# Patient Record
Sex: Male | Born: 1960
Health system: Southern US, Community
[De-identification: ages and names within clinical notes are randomized; demographics above are authoritative.]

## PROBLEM LIST (undated history)

## (undated) DIAGNOSIS — I10 Essential (primary) hypertension: Secondary | ICD-10-CM

## (undated) DIAGNOSIS — R7989 Other specified abnormal findings of blood chemistry: Secondary | ICD-10-CM

## (undated) HISTORY — PX: OTHER SURGICAL HISTORY: SHX169

## (undated) HISTORY — DX: Essential (primary) hypertension: I10

## (undated) HISTORY — PX: SPINAL FUSION: SHX223

## (undated) HISTORY — DX: Other specified abnormal findings of blood chemistry: R79.89

## (undated) HISTORY — PX: JOINT REPLACEMENT: SHX530

---

## 2017-12-14 LAB — HM COLONOSCOPY

## 2018-03-19 DIAGNOSIS — G4700 Insomnia, unspecified: Secondary | ICD-10-CM | POA: Diagnosis not present

## 2018-03-19 DIAGNOSIS — E039 Hypothyroidism, unspecified: Secondary | ICD-10-CM | POA: Diagnosis not present

## 2018-03-19 DIAGNOSIS — Z23 Encounter for immunization: Secondary | ICD-10-CM | POA: Diagnosis not present

## 2018-03-19 DIAGNOSIS — I10 Essential (primary) hypertension: Secondary | ICD-10-CM | POA: Diagnosis not present

## 2018-04-18 DIAGNOSIS — I10 Essential (primary) hypertension: Secondary | ICD-10-CM | POA: Diagnosis not present

## 2018-10-17 DIAGNOSIS — E663 Overweight: Secondary | ICD-10-CM | POA: Diagnosis not present

## 2018-10-17 DIAGNOSIS — F330 Major depressive disorder, recurrent, mild: Secondary | ICD-10-CM | POA: Diagnosis not present

## 2018-10-17 DIAGNOSIS — E039 Hypothyroidism, unspecified: Secondary | ICD-10-CM | POA: Diagnosis not present

## 2018-10-17 DIAGNOSIS — I10 Essential (primary) hypertension: Secondary | ICD-10-CM | POA: Diagnosis not present

## 2018-12-30 DIAGNOSIS — E0789 Other specified disorders of thyroid: Secondary | ICD-10-CM | POA: Diagnosis not present

## 2018-12-30 DIAGNOSIS — E785 Hyperlipidemia, unspecified: Secondary | ICD-10-CM | POA: Diagnosis not present

## 2018-12-30 DIAGNOSIS — I10 Essential (primary) hypertension: Secondary | ICD-10-CM | POA: Diagnosis not present

## 2018-12-30 DIAGNOSIS — L821 Other seborrheic keratosis: Secondary | ICD-10-CM | POA: Diagnosis not present

## 2019-04-21 DIAGNOSIS — F330 Major depressive disorder, recurrent, mild: Secondary | ICD-10-CM | POA: Diagnosis not present

## 2019-04-21 DIAGNOSIS — Z0000 Encounter for general adult medical examination without abnormal findings: Secondary | ICD-10-CM | POA: Diagnosis not present

## 2019-04-21 DIAGNOSIS — E039 Hypothyroidism, unspecified: Secondary | ICD-10-CM | POA: Diagnosis not present

## 2019-06-09 DIAGNOSIS — E039 Hypothyroidism, unspecified: Secondary | ICD-10-CM | POA: Diagnosis not present

## 2019-06-09 DIAGNOSIS — Z0000 Encounter for general adult medical examination without abnormal findings: Secondary | ICD-10-CM | POA: Diagnosis not present

## 2019-09-25 ENCOUNTER — Other Ambulatory Visit: Payer: Self-pay

## 2019-09-25 ENCOUNTER — Ambulatory Visit (INDEPENDENT_AMBULATORY_CARE_PROVIDER_SITE_OTHER): Payer: BC Managed Care – PPO

## 2019-09-25 ENCOUNTER — Ambulatory Visit (INDEPENDENT_AMBULATORY_CARE_PROVIDER_SITE_OTHER): Payer: BC Managed Care – PPO | Admitting: Family Medicine

## 2019-09-25 ENCOUNTER — Encounter: Payer: Self-pay | Admitting: Family Medicine

## 2019-09-25 VITALS — BP 140/93 | HR 53 | Temp 98.0°F | Wt 211.8 lb

## 2019-09-25 DIAGNOSIS — M542 Cervicalgia: Secondary | ICD-10-CM

## 2019-09-25 IMAGING — DX DG CERVICAL SPINE COMPLETE 4+V
6 series · 6 of 6 positions shown · non-contrast
Comparison: None

CLINICAL DATA: None radicular neck pain

EXAM:
CERVICAL SPINE - COMPLETE 4+ VIEW

[c-spine lat]
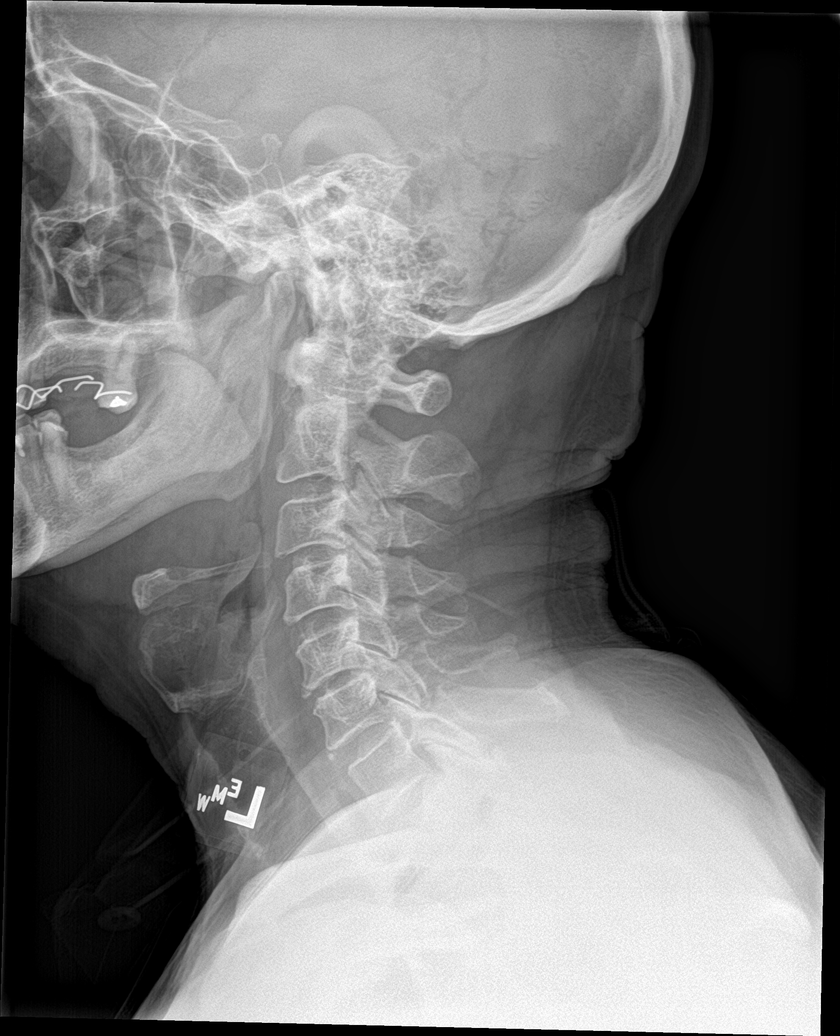

[c-spine obl (1 of 2)]
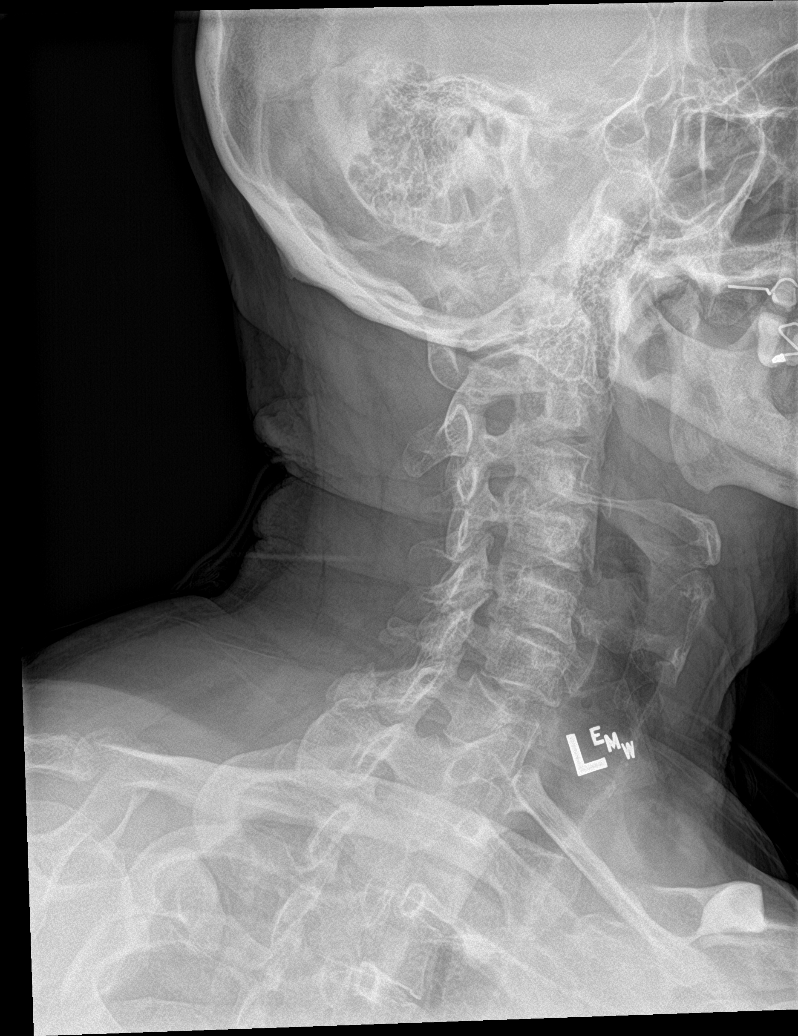

[c-spine obl (2 of 2)]
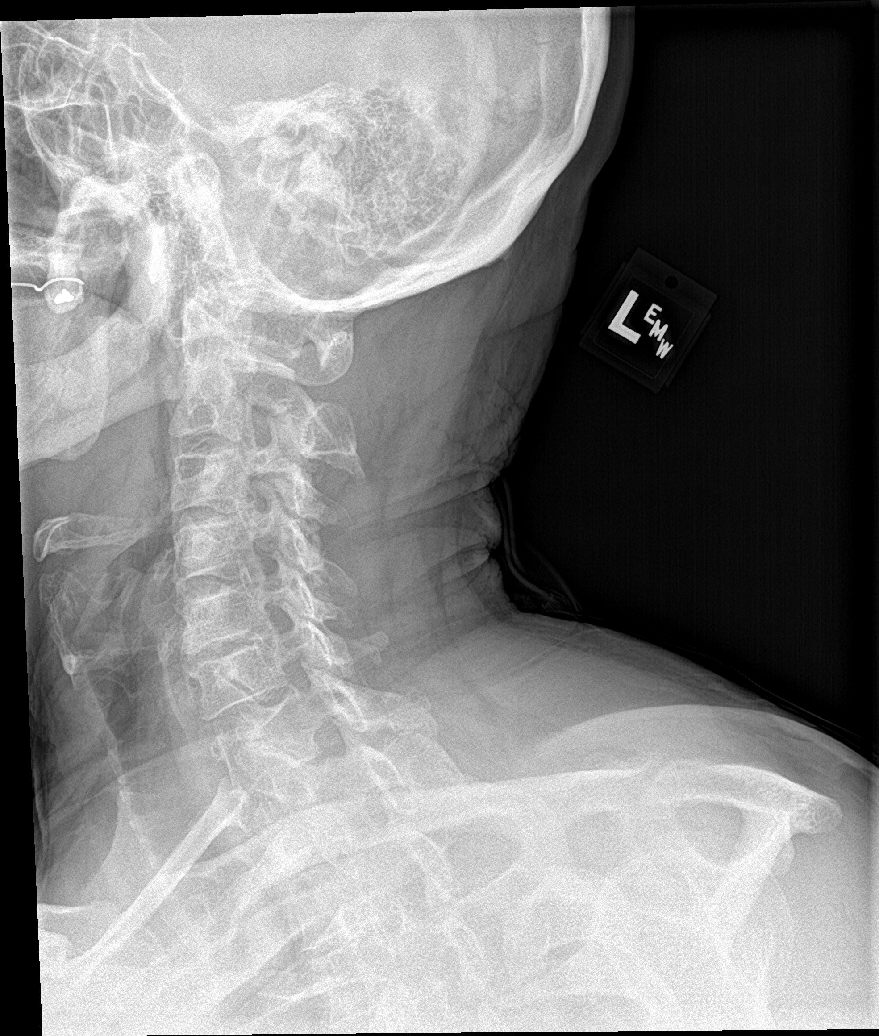

[c-spine ap]
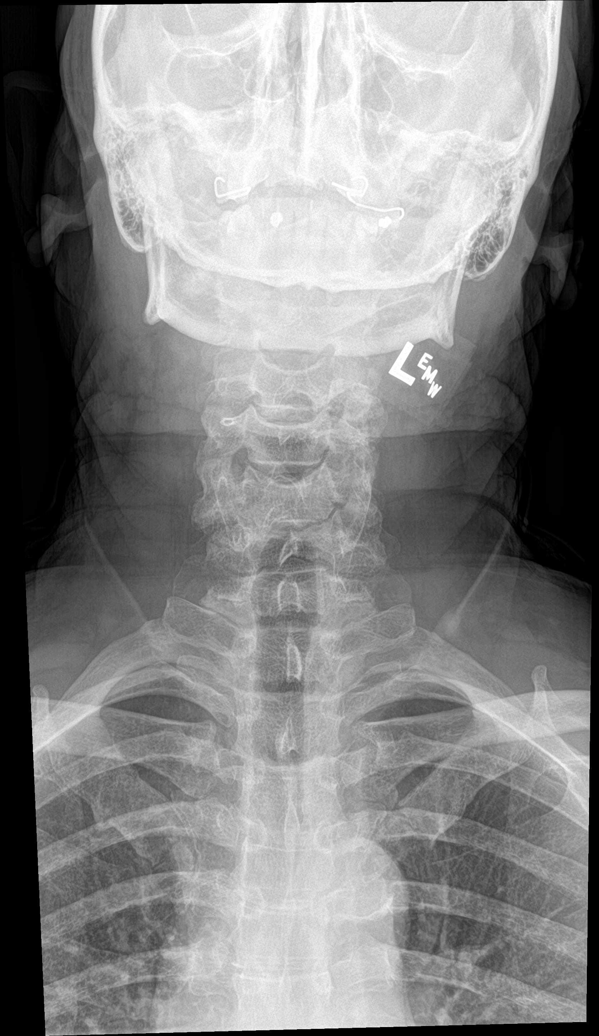

[c-spine open mouth]
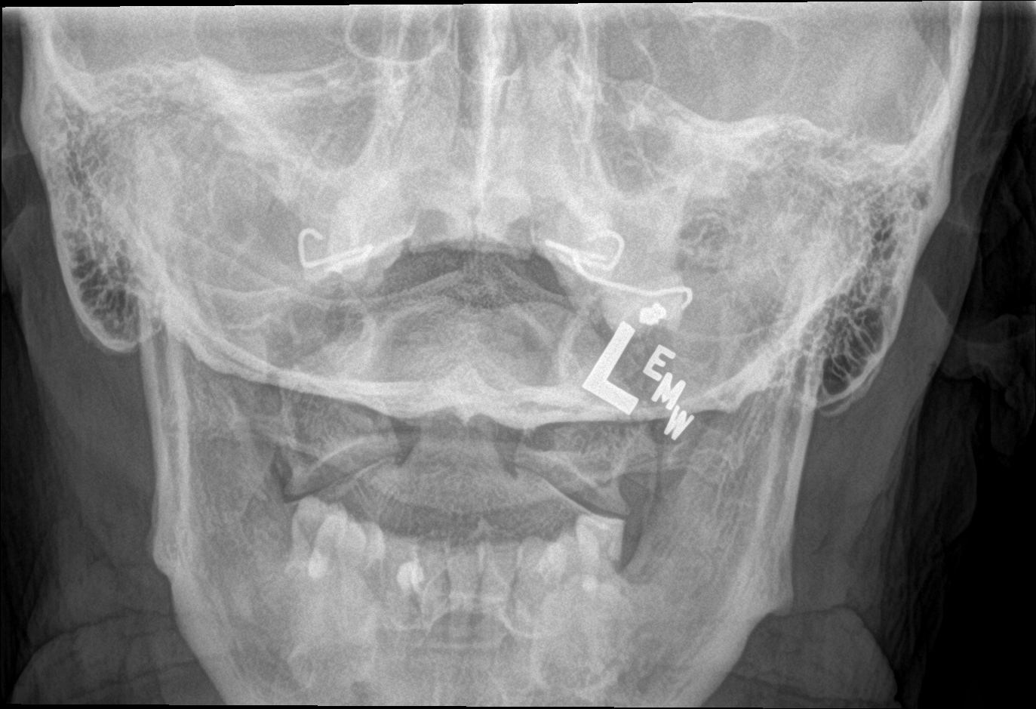

[[person_name]]
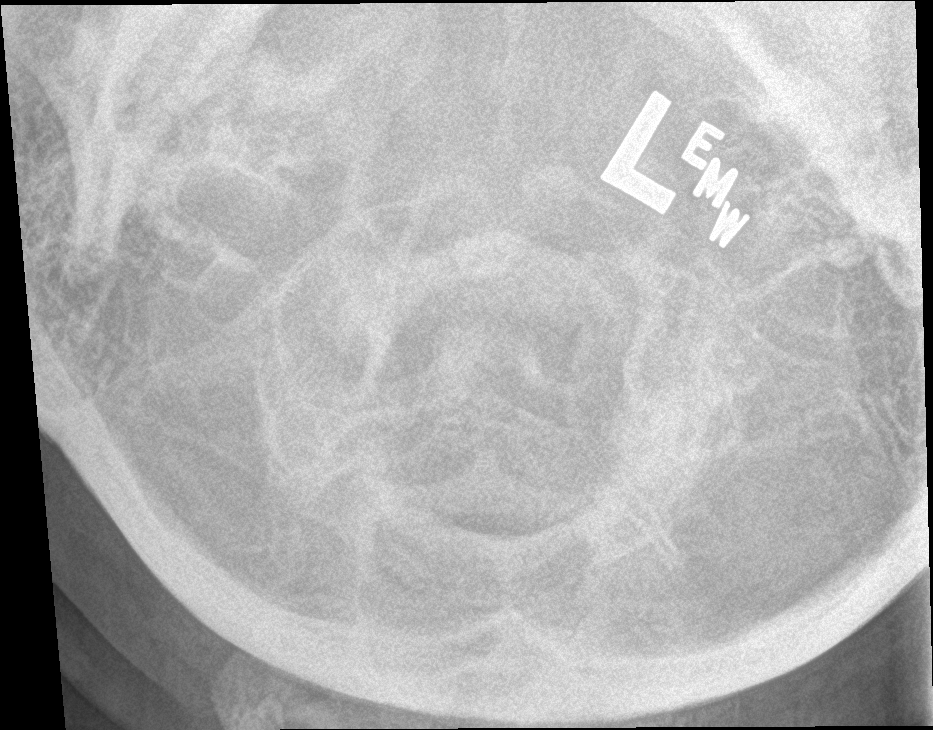

[6 of 6 positions shown; findings below may reference images not displayed]

FINDINGS: Osseous demineralization.

Prevertebral soft tissues normal thickness.

Disc space narrowing and endplate spur formation at C5-C6.

Scattered facet degenerative changes.

Encroachment upon RIGHT C5-C6 neural foramen by a combination of
uncovertebral and facet hypertrophy.

Scattered uncovertebral and facet hypertrophy at additional sites
neural foramina bilaterally, greatest LEFT C3-C4.

No fracture, subluxation or bone destruction.

Atherosclerotic calcification aorta.

C1-C2 alignment normal.
IMPRESSION: Degenerative disc disease changes at C5-C6.

Scattered mild multilevel facet degenerative changes.

Encroachment on cervical neural foramina especially RIGHT C5-C6 and
LEFT C3-C4 by uncovertebral and facet hypertrophy.

## 2019-09-25 MED ORDER — MELOXICAM 7.5 MG PO TABS: ORAL_TABLET | ORAL | 2 refills | Status: DC

## 2019-09-25 MED ORDER — TIZANIDINE HCL 4 MG PO TABS
4.0000 mg | ORAL_TABLET | Freq: Four times a day (QID) | ORAL | 0 refills | Status: DC | PRN
Start: 2019-09-25 — End: 2020-04-15

## 2019-09-25 NOTE — Progress Notes (Signed)
Brian Russell - 59 y.o. male MRN [id number]  Date of birth: [birth year]  Subjective Chief Complaint  Patient presents with  . Establish Care    HPI Brian Russell is a 59 y.o. male with history of HTN, hypothyroidism, hld and gerd here today for initial visit.  Recently moved to area from [place]. Has complaint of neck pain today.  Plans to f/u for chronic conditions next month as he will be due for updated lab work.   -Neck pain:  Pain and stiffness in neck for several months.  Feels grinding sensation when moving neck.  Denies radiation of pain, numbness or tingling into the arms.  He denies prior injury.  He has not tried anything for management of this.    ROS:  A comprehensive ROS was completed and negative except as noted per HPI  Allergies  Allergen Reactions  . Amlodipine   . Buspar [Buspirone]     Past Medical History:  Diagnosis Date  . Hypertension   . Low thyroid stimulating hormone (TSH) level     Past Surgical History:  Procedure Laterality Date  . fractured bone    . JOINT REPLACEMENT    . SPINAL FUSION      Social History   Socioeconomic History  . Marital status: Married    Spouse name: Not on file  . Number of children: Not on file  . Years of education: Not on file  . Highest education level: Not on file  Occupational History  . Occupation: Product manager  Tobacco Use  . Smoking status: Former Smoker    Types: Cigarettes    Quit date: 05/2011    Years since quitting: 8.3  . Smokeless tobacco: Never Used  Substance and Sexual Activity  . Alcohol use: Yes    Alcohol/week: 2.0 standard drinks    Types: 2 Standard drinks or equivalent per week  . Drug use: Never  . Sexual activity: Yes    Partners: Female  Other Topics Concern  . Not on file  Social History Narrative  . Not on file   Social Determinants of Health   Financial Resource Strain:   . Difficulty of Paying Living Expenses:   Food Insecurity:   . Worried About  Charity fundraiser in the Last Year:   . Arboriculturist in the Last Year:   Transportation Needs:   . Film/video editor (Medical):   Marland Kitchen Lack of Transportation (Non-Medical):   Physical Activity:   . Days of Exercise per Week:   . Minutes of Exercise per Session:   Stress:   . Feeling of Stress :   Social Connections:   . Frequency of Communication with Friends and Family:   . Frequency of Social Gatherings with Friends and Family:   . Attends Religious Services:   . Active Member of Clubs or Organizations:   . Attends Archivist Meetings:   Marland Kitchen Marital Status:     Family History  Problem Relation Age of Onset  . Hypertension Other   . Heart attack Other   . Stroke Other     Health Maintenance  Topic Date Due  . Hepatitis C Screening  Never done  . HIV Screening  Never done  . COVID-19 Vaccine (1) Never done  . COLONOSCOPY  Never done  . INFLUENZA VACCINE  12/14/2019  . TETANUS/TDAP  07/24/2021     ----------------------------------------------------------------------------------------------------------------------------------------------------------------------------------------------------------------- Physical Exam BP (!) 140/93 (BP Location: Left Arm, Patient Position: Sitting,  Cuff Size: Large)   Pulse (!) 53   Temp 98 F (36.7 C) (Temporal)   Wt 211 lb 12.8 oz (96.1 kg)   SpO2 97%   Physical Exam Constitutional:      Appearance: Normal appearance.  Eyes:     General: No scleral icterus. Neck:     Comments: ROM is normal but with pain.  Negative spurling.   Ttp along upper cervical paraspinals Cardiovascular:     Rate and Rhythm: Normal rate and regular rhythm.  Pulmonary:     Effort: Pulmonary effort is normal.     Breath sounds: Normal breath sounds.  Neurological:     General: No focal deficit present.     Mental Status: He is alert.  Psychiatric:        Mood and Affect: Mood normal.        Behavior: Behavior normal.      ------------------------------------------------------------------------------------------------------------------------------------------------------------------------------------------------------------------- Assessment and Plan  Neck pain Xray ordered Start tizanidine and meloxicam.  Given handout for home stretches.    Meds ordered this encounter  Medications  . tiZANidine (ZANAFLEX) 4 MG tablet    Sig: Take 1 tablet (4 mg total) by mouth every 6 (six) hours as needed for muscle spasms.    Dispense:  30 tablet    Refill:  0  . meloxicam (MOBIC) 7.5 MG tablet    Sig: 1 tab PO 1-2x per day    Dispense:  30 tablet    Refill:  2    Return in about 6 weeks (around 11/06/2019) for HTN/HLD/Hypothyroidism.    This visit occurred during the SARS-CoV-2 public health emergency.  Safety protocols were in place, including screening questions prior to the visit, additional usage of staff PPE, and extensive cleaning of exam room while observing appropriate contact time as indicated for disinfecting solutions.

## 2019-09-25 NOTE — Assessment & Plan Note (Signed)
Xray ordered Start tizanidine and meloxicam.  Given handout for home stretches.

## 2019-09-25 NOTE — Patient Instructions (Signed)
Great to meet you today! Have xray completed.  Tizanidine and meloxicam as needed.  Try Stretches below.  See me in about 6 weeks.    Neck Exercises Ask your health care provider which exercises are safe for you. Do exercises exactly as told by your health care provider and adjust them as directed. It is normal to feel mild stretching, pulling, tightness, or discomfort as you do these exercises. Stop right away if you feel sudden pain or your pain gets worse. Do not begin these exercises until told by your health care provider. Neck exercises can be important for many reasons. They can improve strength and maintain flexibility in your neck, which will help your upper back and prevent neck pain. Stretching exercises Rotation neck stretching  1. Sit in a chair or stand up. 2. Place your feet flat on the floor, shoulder width apart. 3. Slowly turn your head (rotate) to the right until a slight stretch is felt. Turn it all the way to the right so you can look over your right shoulder. Do not tilt or tip your head. 4. Hold this position for 10-30 seconds. 5. Slowly turn your head (rotate) to the left until a slight stretch is felt. Turn it all the way to the left so you can look over your left shoulder. Do not tilt or tip your head. 6. Hold this position for 10-30 seconds. Repeat __________ times. Complete this exercise __________ times a day. Neck retraction 1. Sit in a sturdy chair or stand up. 2. Look straight ahead. Do not bend your neck. 3. Use your fingers to push your chin backward (retraction). Do not bend your neck for this movement. Continue to face straight ahead. If you are doing the exercise properly, you will feel a slight sensation in your throat and a stretch at the back of your neck. 4. Hold the stretch for 1-2 seconds. Repeat __________ times. Complete this exercise __________ times a day. Strengthening exercises Neck press 1. Lie on your back on a firm bed or on the floor  with a pillow under your head. 2. Use your neck muscles to push your head down on the pillow and straighten your spine. 3. Hold the position as well as you can. Keep your head facing up (in a neutral position) and your chin tucked. 4. Slowly count to 5 while holding this position. Repeat __________ times. Complete this exercise __________ times a day. Isometrics These are exercises in which you strengthen the muscles in your neck while keeping your neck still (isometrics). 1. Sit in a supportive chair and place your hand on your forehead. 2. Keep your head and face facing straight ahead. Do not flex or extend your neck while doing isometrics. 3. Push forward with your head and neck while pushing back with your hand. Hold for 10 seconds. 4. Do the sequence again, this time putting your hand against the back of your head. Use your head and neck to push backward against the hand pressure. 5. Finally, do the same exercise on either side of your head, pushing sideways against the pressure of your hand. Repeat __________ times. Complete this exercise __________ times a day. Prone head lifts 1. Lie face-down (prone position), resting on your elbows so that your chest and upper back are raised. 2. Start with your head facing downward, near your chest. Position your chin either on or near your chest. 3. Slowly lift your head upward. Lift until you are looking straight ahead. Then continue lifting  your head as far back as you can comfortably stretch. 4. Hold your head up for 5 seconds. Then slowly lower it to your starting position. Repeat __________ times. Complete this exercise __________ times a day. Supine head lifts 1. Lie on your back (supine position), bending your knees to point to the ceiling and keeping your feet flat on the floor. 2. Lift your head slowly off the floor, raising your chin toward your chest. 3. Hold for 5 seconds. Repeat __________ times. Complete this exercise __________ times  a day. Scapular retraction 1. Stand with your arms at your sides. Look straight ahead. 2. Slowly pull both shoulders (scapulae) backward and downward (retraction) until you feel a stretch between your shoulder blades in your upper back. 3. Hold for 10-30 seconds. 4. Relax and repeat. Repeat __________ times. Complete this exercise __________ times a day. Contact a health care provider if:  Your neck pain or discomfort gets much worse when you do an exercise.  Your neck pain or discomfort does not improve within 2 hours after you exercise. If you have any of these problems, stop exercising right away. Do not do the exercises again unless your health care provider says that you can. Get help right away if:  You develop sudden, severe neck pain. If this happens, stop exercising right away. Do not do the exercises again unless your health care provider says that you can. This information is not intended to replace advice given to you by your health care provider. Make sure you discuss any questions you have with your health care provider. Document Revised: 02/27/2018 Document Reviewed: 02/27/2018 Elsevier Patient Education  [place].

## 2019-09-29 ENCOUNTER — Other Ambulatory Visit: Payer: Self-pay

## 2019-09-29 ENCOUNTER — Telehealth: Payer: Self-pay | Admitting: Family Medicine

## 2019-09-29 DIAGNOSIS — E785 Hyperlipidemia, unspecified: Secondary | ICD-10-CM

## 2019-09-29 DIAGNOSIS — I10 Essential (primary) hypertension: Secondary | ICD-10-CM

## 2019-09-29 DIAGNOSIS — Z125 Encounter for screening for malignant neoplasm of prostate: Secondary | ICD-10-CM

## 2019-09-29 DIAGNOSIS — Z0000 Encounter for general adult medical examination without abnormal findings: Secondary | ICD-10-CM

## 2019-09-29 NOTE — Telephone Encounter (Signed)
CMP, CBC, Lipid, TSH, PSA Diagnosis: Well adult, HLD, essential hypertension and screening for prostate cancer.   Thanks!  CM

## 2019-09-29 NOTE — Telephone Encounter (Signed)
What labs would you like ordered for Mr. Bramblett physical?

## 2019-09-29 NOTE — Telephone Encounter (Signed)
Pt called. He scheduled his physical for 5/26 and would like to have labs sent down on 5/24.  Thanks

## 2019-09-29 NOTE — Telephone Encounter (Signed)
Labs have been ordered

## 2019-10-06 ENCOUNTER — Encounter: Payer: Self-pay | Admitting: Family Medicine

## 2019-10-07 DIAGNOSIS — I10 Essential (primary) hypertension: Secondary | ICD-10-CM | POA: Diagnosis not present

## 2019-10-07 DIAGNOSIS — Z0000 Encounter for general adult medical examination without abnormal findings: Secondary | ICD-10-CM | POA: Diagnosis not present

## 2019-10-07 DIAGNOSIS — Z125 Encounter for screening for malignant neoplasm of prostate: Secondary | ICD-10-CM | POA: Diagnosis not present

## 2019-10-08 ENCOUNTER — Encounter: Payer: BC Managed Care – PPO | Admitting: Family Medicine

## 2019-10-08 LAB — LIPID PANEL
Cholesterol: 190 mg/dL (ref <200)
HDL: 46 mg/dL (ref >=40)
LDL Cholesterol (Calc): 126 mg/dL (calc) — ABNORMAL HIGH
Non-HDL Cholesterol (Calc): 144 mg/dL (calc) — ABNORMAL HIGH (ref <130)
Total CHOL/HDL Ratio: 4.1 (calc) (ref <5.0)
Triglycerides: 83 mg/dL (ref <150)

## 2019-10-08 LAB — COMPLETE METABOLIC PANEL WITH GFR
AG Ratio: 2.2 (calc) (ref 1.0–2.5)
ALT: 18 U/L (ref 9–46)
AST: 17 U/L (ref 10–35)
Albumin: 4.4 g/dL (ref 3.6–5.1)
Alkaline phosphatase (APISO): 43 U/L (ref 35–144)
BUN: 19 mg/dL (ref 7–25)
CO2: 32 mmol/L (ref 20–32)
Calcium: 9.3 mg/dL (ref 8.6–10.3)
Chloride: 101 mmol/L (ref 98–110)
Creat: 1.08 mg/dL (ref 0.70–1.25)
GFR, Est African American: 86 mL/min/{1.73_m2} (ref >=60)
GFR, Est Non African American: 74 mL/min/{1.73_m2} (ref >=60)
Globulin: 2 g/dL (calc) (ref 1.9–3.7)
Glucose, Bld: 88 mg/dL (ref 65–99)
Potassium: 4.2 mmol/L (ref 3.5–5.3)
Sodium: 141 mmol/L (ref 135–146)
Total Bilirubin: 0.6 mg/dL (ref 0.2–1.2)
Total Protein: 6.4 g/dL (ref 6.1–8.1)

## 2019-10-08 LAB — CBC WITH DIFFERENTIAL/PLATELET
Absolute Monocytes: 555 cells/uL (ref 200–950)
Basophils Absolute: 58 cells/uL (ref 0–200)
Basophils Relative: 0.8 %
Eosinophils Absolute: 168 cells/uL (ref 15–500)
Eosinophils Relative: 2.3 %
HCT: 42.9 % (ref 38.5–50.0)
Hemoglobin: 14.4 g/dL (ref 13.2–17.1)
Lymphs Abs: 1891 cells/uL (ref 850–3900)
MCH: 30.3 pg (ref 27.0–33.0)
MCHC: 33.6 g/dL (ref 32.0–36.0)
MCV: 90.3 fL (ref 80.0–100.0)
MPV: 10.3 fL (ref 7.5–12.5)
Monocytes Relative: 7.6 %
Neutro Abs: 4628 cells/uL (ref 1500–7800)
Neutrophils Relative %: 63.4 %
Platelets: 163 10*3/uL (ref 140–400)
RBC: 4.75 10*6/uL (ref 4.20–5.80)
RDW: 12.9 % (ref 11.0–15.0)
Total Lymphocyte: 25.9 %
WBC: 7.3 10*3/uL (ref 3.8–10.8)

## 2019-10-08 LAB — TSH: TSH: 0.6 mIU/L (ref 0.40–4.50)

## 2019-10-08 LAB — PSA: PSA: 0.4 ng/mL (ref <=4.0)

## 2019-10-09 ENCOUNTER — Encounter: Payer: BC Managed Care – PPO | Admitting: Family Medicine

## 2019-10-15 ENCOUNTER — Ambulatory Visit (INDEPENDENT_AMBULATORY_CARE_PROVIDER_SITE_OTHER): Payer: BC Managed Care – PPO | Admitting: Family Medicine

## 2019-10-15 ENCOUNTER — Encounter: Payer: Self-pay | Admitting: Family Medicine

## 2019-10-15 ENCOUNTER — Other Ambulatory Visit: Payer: Self-pay

## 2019-10-15 VITALS — BP 130/83 | HR 59 | Wt 209.7 lb

## 2019-10-15 DIAGNOSIS — I10 Essential (primary) hypertension: Secondary | ICD-10-CM | POA: Diagnosis not present

## 2019-10-15 DIAGNOSIS — E039 Hypothyroidism, unspecified: Secondary | ICD-10-CM | POA: Diagnosis not present

## 2019-10-15 DIAGNOSIS — M545 Low back pain, unspecified: Secondary | ICD-10-CM

## 2019-10-15 DIAGNOSIS — B353 Tinea pedis: Secondary | ICD-10-CM

## 2019-10-15 DIAGNOSIS — D489 Neoplasm of uncertain behavior, unspecified: Secondary | ICD-10-CM | POA: Diagnosis not present

## 2019-10-15 DIAGNOSIS — Z0000 Encounter for general adult medical examination without abnormal findings: Secondary | ICD-10-CM | POA: Insufficient documentation

## 2019-10-15 DIAGNOSIS — E785 Hyperlipidemia, unspecified: Secondary | ICD-10-CM | POA: Insufficient documentation

## 2019-10-15 DIAGNOSIS — G8929 Other chronic pain: Secondary | ICD-10-CM

## 2019-10-15 MED ORDER — LOSARTAN POTASSIUM 100 MG PO TABS: 100.0000 mg | ORAL_TABLET | Freq: Every day | ORAL | 2 refills | Status: DC

## 2019-10-15 MED ORDER — TERBINAFINE HCL 250 MG PO TABS: 250.0000 mg | ORAL_TABLET | Freq: Every day | ORAL | 0 refills | Status: AC

## 2019-10-15 MED ORDER — CHLORTHALIDONE 25 MG PO TABS: 25.0000 mg | ORAL_TABLET | Freq: Every day | ORAL | 2 refills | Status: DC

## 2019-10-15 NOTE — Progress Notes (Signed)
Brian Russell - 59 y.o. male MRN [id number]  Date of birth: 09/25/1960  Subjective Chief Complaint  Patient presents with  . Annual Exam    HPI Brian Russell is a 59 y.o. male here today for annual exam.  He needs refills on chlorthalidone and losartan for his BP.  He is tolerating these well and BP remains well controlled.    Recent labs reviewed.  TSH wnl.  LDL elevated.  He has rx for lipitor but has not started yet.    He has history of chronic low back pain.  Had decompression with fusion previously however this failed to relieve his pain.  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  He never filed a claim during service because he was always told to tough it out.  He is in the process of filing a disability claim through the [place].    Also has had recurrent athletes foot. Has tried otc topicals but keeps returning.    He is a former smoker.  He consumes EtOH occasionally.   He remains pretty active and tries to follow a healthy diet.   Review of Systems  Constitutional: Negative for chills, fever, malaise/fatigue and weight loss.  HENT: Negative for congestion, ear pain and sore throat.   Eyes: Negative for blurred vision, double vision and pain.  Respiratory: Negative for cough and shortness of breath.   Cardiovascular: Negative for chest pain and palpitations.  Gastrointestinal: Negative for abdominal pain, blood in stool, constipation, heartburn and nausea.  Genitourinary: Negative for dysuria and urgency.  Musculoskeletal: Positive for back pain. Negative for joint pain and myalgias.  Neurological: Negative for dizziness and headaches.  Endo/Heme/Allergies: Does not bruise/bleed easily.  Psychiatric/Behavioral: Negative for depression. The patient is not nervous/anxious and does not have insomnia.     Allergies  Allergen Reactions  . Amlodipine   . Buspar [Buspirone]     Past Medical History:   Diagnosis Date  . Hypertension   . Low thyroid stimulating hormone (TSH) level     Past Surgical History:  Procedure Laterality Date  . fractured bone    . JOINT REPLACEMENT    . SPINAL FUSION      Social History   Socioeconomic History  . Marital status: Married    Spouse name: Not on file  . Number of children: Not on file  . Years of education: Not on file  . Highest education level: Not on file  Occupational History  . Occupation: Product manager  Tobacco Use  . Smoking status: Former Smoker    Types: Cigarettes    Quit date: 05/2011    Years since quitting: 8.4  . Smokeless tobacco: Never Used  Substance and Sexual Activity  . Alcohol use: Yes    Alcohol/week: 2.0 standard drinks    Types: 2 Standard drinks or equivalent per week  . Drug use: Never  . Sexual activity: Yes    Partners: Female  Other Topics Concern  . Not on file  Social History Narrative  . Not on file   Social Determinants of Health   Financial Resource Strain:   . Difficulty of Paying Living Expenses:   Food Insecurity:   . Worried About Charity fundraiser in the Last Year:   . Arboriculturist in the Last Year:   Transportation Needs:   . Film/video editor (Medical):   Marland Kitchen Lack of  Transportation (Non-Medical):   Physical Activity:   . Days of Exercise per Week:   . Minutes of Exercise per Session:   Stress:   . Feeling of Stress :   Social Connections:   . Frequency of Communication with Friends and Family:   . Frequency of Social Gatherings with Friends and Family:   . Attends Religious Services:   . Active Member of Clubs or Organizations:   . Attends Archivist Meetings:   Marland Kitchen Marital Status:     Family History  Problem Relation Age of Onset  . Hypertension Other   . Heart attack Other   . Stroke Other     Health Maintenance  Topic Date Due  . Hepatitis C Screening  Never done  . COVID-19 Vaccine (1) Never done  . HIV Screening  Never done  .  INFLUENZA VACCINE  12/14/2019  . TETANUS/TDAP  07/24/2021  . COLONOSCOPY  12/15/2027     ----------------------------------------------------------------------------------------------------------------------------------------------------------------------------------------------------------------- Physical Exam BP 130/83 (BP Location: Left Arm, Patient Position: Sitting, Cuff Size: Normal)   Pulse (!) 59   Wt 209 lb 11.2 oz (95.1 kg)   SpO2 97%   Physical Exam Constitutional:      General: He is not in acute distress. HENT:     Head: Normocephalic and atraumatic.     Right Ear: External ear normal.     Left Ear: External ear normal.  Eyes:     General: No scleral icterus. Neck:     Thyroid: No thyromegaly.  Cardiovascular:     Rate and Rhythm: Normal rate and regular rhythm.     Heart sounds: Normal heart sounds.  Pulmonary:     Effort: Pulmonary effort is normal.     Breath sounds: Normal breath sounds.  Abdominal:     General: Bowel sounds are normal. There is no distension.     Palpations: Abdomen is soft.     Tenderness: There is no abdominal tenderness. There is no guarding.  Musculoskeletal:     Cervical back: Normal range of motion.  Lymphadenopathy:     Cervical: No cervical adenopathy.  Skin:    General: Skin is warm and dry.     Findings: No rash.     Comments: Dry, flaky skin on erythematous base on bilateral feet.   Neurological:     Mental Status: He is alert and oriented to person, place, and time.     Cranial Nerves: No cranial nerve deficit.     Motor: No abnormal muscle tone.  Psychiatric:        Behavior: Behavior normal.     ------------------------------------------------------------------------------------------------------------------------------------------------------------------------------------------------------------------- Assessment and Plan  Essential hypertension Blood pressure is at goal at for age and co-morbidities.  I  recommend he continue current medications.  Chlorthalidone and losartan renewed.  In addition they were instructed to follow a low sodium diet with regular exercise to help to maintain adequate control of blood pressure.    Hypothyroidism Doing well.  Recent TSH wnl.   HLD (hyperlipidemia) LDL elevated.  The 10-year ASCVD risk score: 10.4% Recommend starting atorvastatin.      Chronic low back pain Failed laminectomy/decompression with continued pain.  He will plan to schedule appt with Dr. Darene Lamer to discuss additional conservative options for management of his back pain.  He is currently applying for disability through the [place] as well.    Tinea pedis He has tried and failed trial of topical medications.  Start terbinafine 250mg  daily x6 weeks.   Well adult  exam Recent labs and chronic conditions reviewed.  He is up to date on immunizations. Declines COVID vaccine.  Screenings: UTD Anticipatory guidance/risk factor reduction: See additional problems and AVS  Neoplasm of uncertain behavior Requests dermatology referral, entered.    Meds ordered this encounter  Medications  . terbinafine (LAMISIL) 250 MG tablet    Sig: Take 1 tablet (250 mg total) by mouth daily.    Dispense:  42 tablet    Refill:  0  . chlorthalidone (HYGROTON) 25 MG tablet    Sig: Take 1 tablet (25 mg total) by mouth daily.    Dispense:  90 tablet    Refill:  2  . losartan (COZAAR) 100 MG tablet    Sig: Take 1 tablet (100 mg total) by mouth daily.    Dispense:  90 tablet    Refill:  2    Return in about 6 months (around 04/15/2020) for HTN/HLD.    This visit occurred during the SARS-CoV-2 public health emergency.  Safety protocols were in place, including screening questions prior to the visit, additional usage of staff PPE, and extensive cleaning of exam room while observing appropriate contact time as indicated for disinfecting solutions.

## 2019-10-15 NOTE — Patient Instructions (Signed)
Great to see you! I would recommend adding on atorvastatin (lipitor) to reduce your risk of heart disease.  I would recommend having you see Dr. Darene Lamer for your back pain.  I have entered referral to dermatology, you will be contacted for appointment.   I will plan to follow up with you in 6 months.

## 2019-10-15 NOTE — Assessment & Plan Note (Signed)
LDL elevated.  The 10-year ASCVD risk score: 10.4% Recommend starting atorvastatin.

## 2019-10-15 NOTE — Assessment & Plan Note (Signed)
Doing well.  Recent TSH wnl.

## 2019-10-15 NOTE — Assessment & Plan Note (Signed)
Recent labs and chronic conditions reviewed.  He is up to date on immunizations. Declines COVID vaccine.  Screenings: UTD Anticipatory guidance/risk factor reduction: See additional problems and AVS

## 2019-10-15 NOTE — Assessment & Plan Note (Signed)
He has tried and failed trial of topical medications.  Start terbinafine 250mg  daily x6 weeks.

## 2019-10-15 NOTE — Assessment & Plan Note (Signed)
Failed laminectomy/decompression with continued pain.  He will plan to schedule appt with Dr. Darene Lamer to discuss additional conservative options for management of his back pain.  He is currently applying for disability through the [place] as well.

## 2019-10-15 NOTE — Assessment & Plan Note (Signed)
Requests dermatology referral, entered.

## 2019-10-15 NOTE — Assessment & Plan Note (Signed)
Blood pressure is at goal at for age and co-morbidities.  I recommend he continue current medications.  Chlorthalidone and losartan renewed.  In addition they were instructed to follow a low sodium diet with regular exercise to help to maintain adequate control of blood pressure.

## 2019-10-22 ENCOUNTER — Institutional Professional Consult (permissible substitution): Payer: BC Managed Care – PPO | Admitting: Sports Medicine

## 2019-10-29 ENCOUNTER — Ambulatory Visit (INDEPENDENT_AMBULATORY_CARE_PROVIDER_SITE_OTHER): Payer: BC Managed Care – PPO

## 2019-10-29 ENCOUNTER — Ambulatory Visit (INDEPENDENT_AMBULATORY_CARE_PROVIDER_SITE_OTHER): Payer: BC Managed Care – PPO | Admitting: Sports Medicine

## 2019-10-29 ENCOUNTER — Encounter: Payer: Self-pay | Admitting: Sports Medicine

## 2019-10-29 ENCOUNTER — Other Ambulatory Visit: Payer: Self-pay

## 2019-10-29 DIAGNOSIS — M545 Low back pain, unspecified: Secondary | ICD-10-CM

## 2019-10-29 DIAGNOSIS — G8929 Other chronic pain: Secondary | ICD-10-CM | POA: Diagnosis not present

## 2019-10-29 IMAGING — DX DG LUMBAR SPINE COMPLETE 4+V
5 series · 5 of 5 positions shown · non-contrast
Comparison: None.

CLINICAL DATA: Low back pain.  No known injury.

EXAM:
LUMBAR SPINE - COMPLETE 4+ VIEW

[l-spine ap]
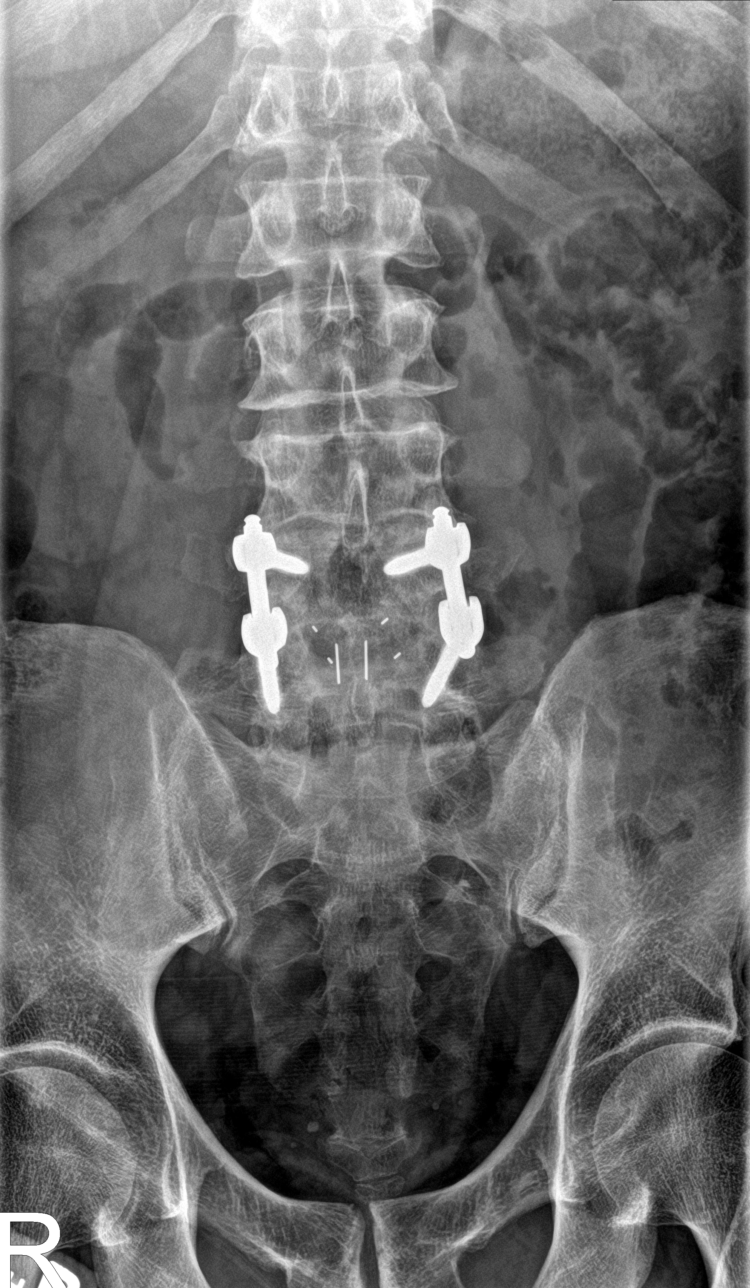

[l-spine obl (1 of 2)]
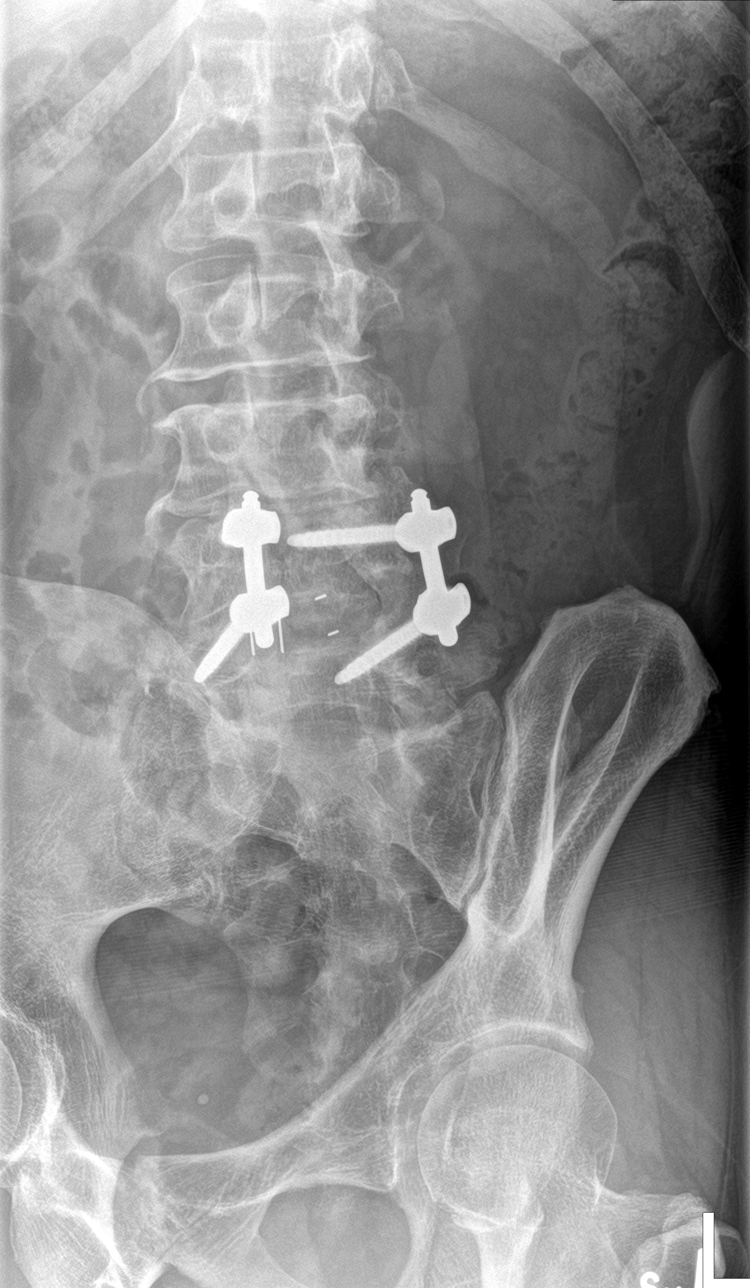

[l-spine obl (2 of 2)]
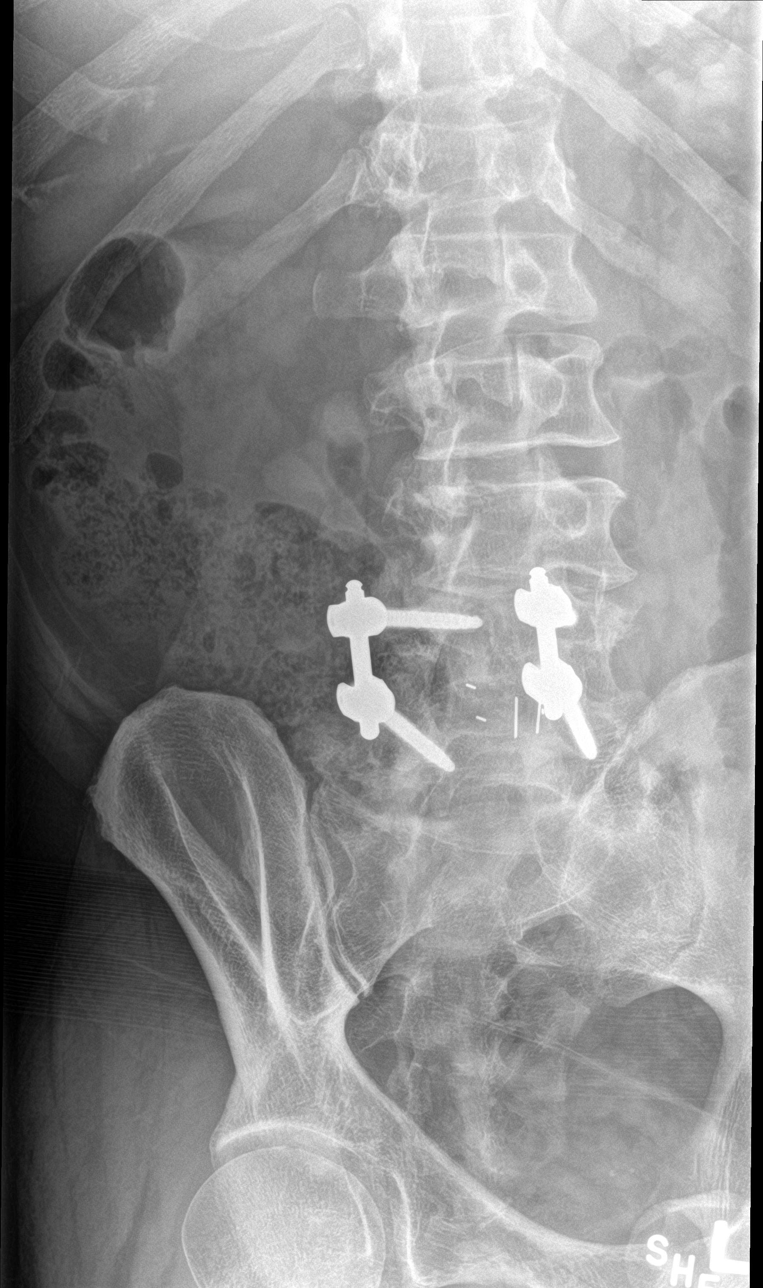

[l-spine lat]
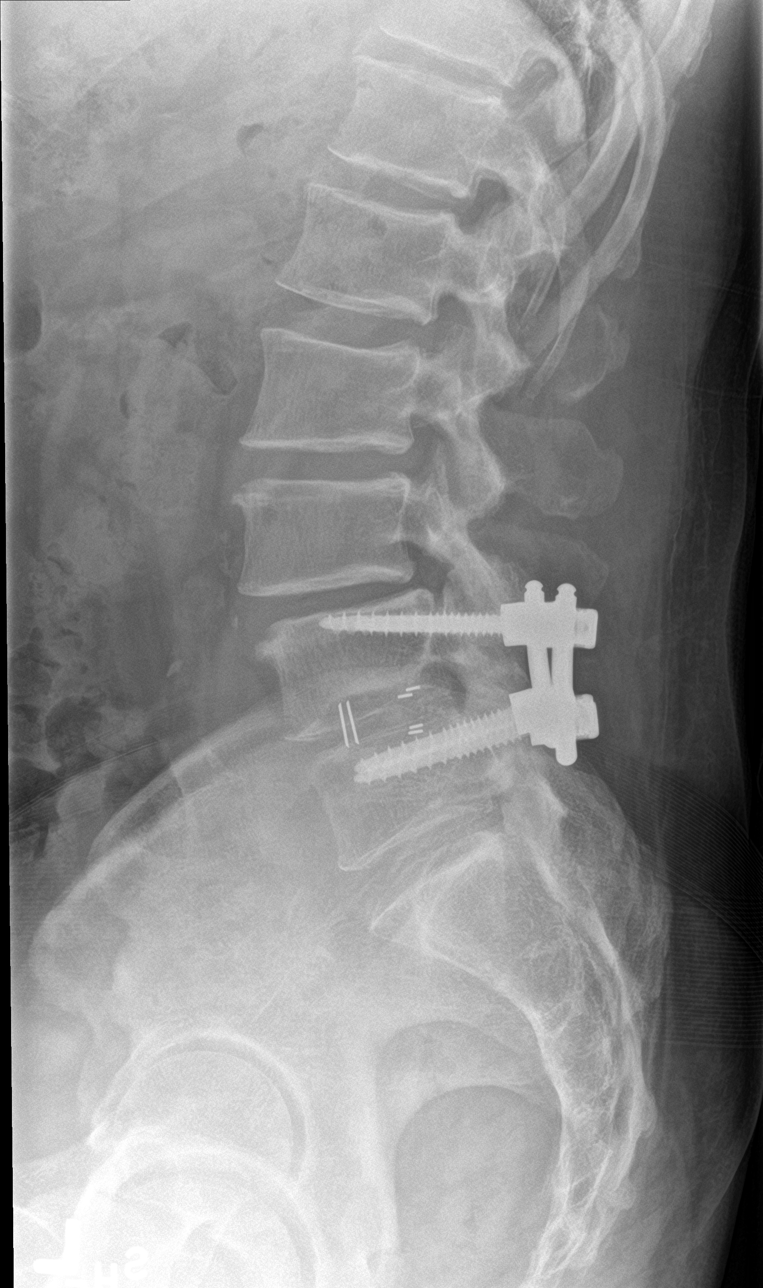

[l-spine spot]
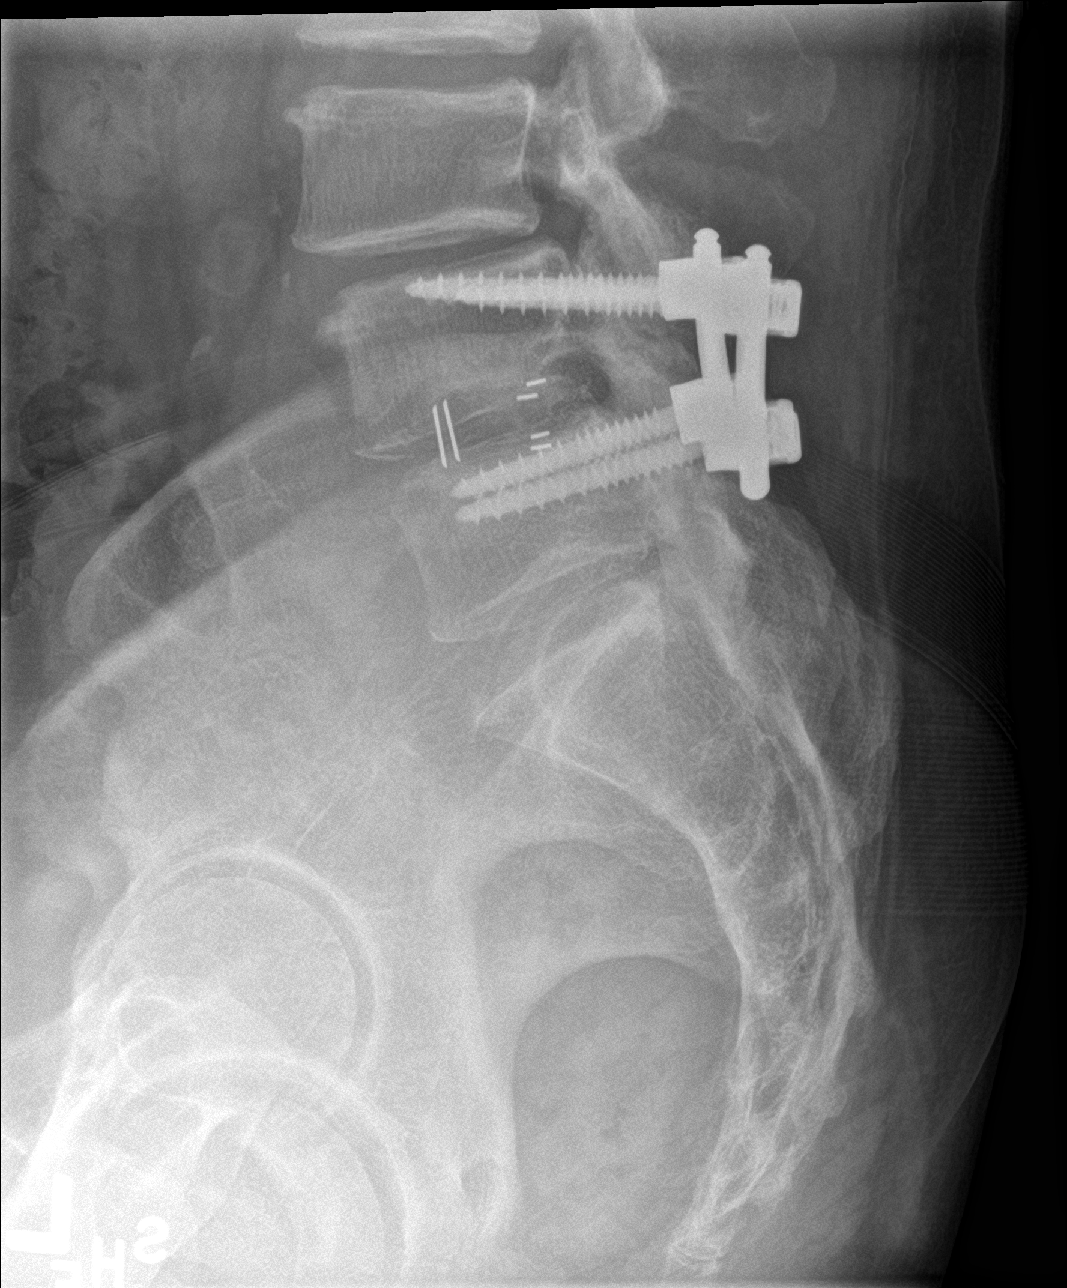

[5 of 5 positions shown; findings below may reference images not displayed]

FINDINGS: No evidence of fracture. Prior PLIF seen at L4-5. Mild-to-moderate
degenerative disc disease with grade 1 anterolisthesis measuring 6
mm is seen at L3-4. Mild degenerative disc disease seen at L2-3.
IMPRESSION: 1. No acute findings.
2. Previous PLIF at L4-5. Degenerative spondylosis, as described
above.

## 2019-10-29 MED ORDER — MELOXICAM 15 MG PO TABS: ORAL_TABLET | ORAL | 3 refills | Status: DC

## 2019-10-29 MED ORDER — GABAPENTIN 800 MG PO TABS: 800.0000 mg | ORAL_TABLET | Freq: Every day | ORAL | 3 refills | Status: DC

## 2019-10-29 NOTE — Assessment & Plan Note (Signed)
Brian Russell is a long history of low back pain, this started decades ago, he was injured in [employer], ultimately he needed what sounds to be a laminectomy. He never had any relief, not even temporary. More recently he has had a persistent pain, mid low back radiating into the hips and thighs but not past the knees, worse with standing, he does need to flex his spine when walking all consistent with lumbar spinal stenosis. He is interested in aggressive treatment without surgery. We will start conservative, full dose meloxicam, gabapentin 800 mg at night, x-rays, formal physical therapy. Return to see me in 6 weeks, MRI for interventional planning if no better.

## 2019-10-29 NOTE — Progress Notes (Signed)
    Procedures performed today:    None.  Independent interpretation of notes and tests performed by another provider:   None.  Brief History, Exam, Impression, and Recommendations:    Chronic low back pain Brian Russell is a long history of low back pain, this started decades ago, he was injured in [employer], ultimately he needed what sounds to be a laminectomy. He never had any relief, not even temporary. More recently he has had a persistent pain, mid low back radiating into the hips and thighs but not past the knees, worse with standing, he does need to flex his spine when walking all consistent with lumbar spinal stenosis. He is interested in aggressive treatment without surgery. We will start conservative, full dose meloxicam, gabapentin 800 mg at night, x-rays, formal physical therapy. Return to see me in 6 weeks, MRI for interventional planning if no better.    ___________________________________________ Gwen Her. Dianah Field, M.D., ABFM., CAQSM. Primary Care and [place] Instructor of [place] [facility]

## 2019-11-07 ENCOUNTER — Ambulatory Visit: Payer: BC Managed Care – PPO | Admitting: Physical Therapy

## 2019-11-10 ENCOUNTER — Other Ambulatory Visit: Payer: Self-pay

## 2019-11-10 ENCOUNTER — Ambulatory Visit (INDEPENDENT_AMBULATORY_CARE_PROVIDER_SITE_OTHER): Payer: BC Managed Care – PPO | Admitting: Rehabilitative and Restorative Service Providers"

## 2019-11-10 DIAGNOSIS — M545 Low back pain, unspecified: Secondary | ICD-10-CM

## 2019-11-10 DIAGNOSIS — R29898 Other symptoms and signs involving the musculoskeletal system: Secondary | ICD-10-CM

## 2019-11-10 DIAGNOSIS — G8929 Other chronic pain: Secondary | ICD-10-CM

## 2019-11-10 DIAGNOSIS — R2689 Other abnormalities of gait and mobility: Secondary | ICD-10-CM | POA: Diagnosis not present

## 2019-11-10 DIAGNOSIS — R293 Abnormal posture: Secondary | ICD-10-CM

## 2019-11-10 NOTE — Therapy (Signed)
[facility] [place], [place], [address] Phone: [phone]   Fax:  [phone]  Physical Therapy Evaluation  Patient Details  Name: Jacquelyn Antony MRN: [id number] Date of Birth: 01-30-1961 Referring Provider (PT): Dr Dianah Field    Encounter Date: 11/10/2019   PT End of Session - 11/10/19 1512    Visit Number 1    Number of Visits 12    Date for PT Re-Evaluation 12/22/19    PT Start Time 1512    PT Stop Time 1600    PT Time Calculation (min) 48 min    Activity Tolerance Patient tolerated treatment well           Past Medical History:  Diagnosis Date  . Hypertension   . Low thyroid stimulating hormone (TSH) level     Past Surgical History:  Procedure Laterality Date  . fractured bone    . JOINT REPLACEMENT    . SPINAL FUSION      There were no vitals filed for this visit.    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 There was not one specific injury - just over time had pain. He has lived with the pain for ~ 36 yrs. he has had some increase in symptoms in the past 2 years and wife motices that he has started to lean more forward and to the side.    Pertinent History lumbar surgery for treatment of LBP with rods and screws in L4/L5 ~ 8 yrs agono change in symptoms (Dr Juliene Pina - [facility]) DDD L3/4/5; HTN    Diagnostic tests MRI    Patient Stated Goals wants to ease the pain and get healthier    Currently in Pain? Yes    Pain Score 0-No pain   standing 8/10   Pain Location Back    Pain Orientation Right;Left;Lower    Pain Descriptors / Indicators Sharp;Constant   constant with stading or walking   Pain Type Chronic pain    Pain Radiating Towards bilat buttocks    Pain Onset More than a month ago    Pain Frequency Intermittent    Aggravating Factors  standing; walking; bending    Pain  Relieving Factors sitting; lying down              [facility] PT Assessment - 11/10/19 0001      Assessment   Medical Diagnosis LBP     Referring Provider (PT) Dr Dianah Field     Onset Date/Surgical Date 10/13/17   LBP x 36 yrs    Hand Dominance Right    Next MD Visit 12/10/19    Prior Therapy yes 36 yrs ago and following back sx 8 yrs ago - no improvement       Precautions   Precautions None      Restrictions   Weight Bearing Restrictions No      Balance Screen   Has the patient fallen in the past 6 months No    Has the patient had a decrease in activity level because of a fear of falling?  No    Is the patient reluctant to leave their home because of a fear of falling?  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; moving throughout the 9 hrs/day 5 days/wk to 5.5 days/wk - for the past 32 yrs     Leisure yard work; Pensions consultant; gun competetions       Observation/Other Assessments   Observations 2 finger rectus diastasis     Focus on Therapeutic Outcomes (FOTO)  51% limitation       Sensation   Additional Comments WFL's per pt report       Posture/Postural Control   Posture/Postural Control --   sits with elbows propped on knees flexed forward    Posture Comments upper body shifted to the Rt; flexed forward at hips; Lt pelvis higher than Rt       AROM   Lumbar Flexion 40% increased pain 70% with increasing pain - pain in the lower sacral area     Lumbar Extension 25% pain in lower sacral area    Lumbar - Right Side Bend 85% pain Rt side and lower sacral area    Lumbar - Left Side Bend 75% pain in the  lower sacral area     Lumbar - Right Rotation 15% pain in the lower sacral area    Lumbar - Left Rotation 30% pain in the lower sacral area       Strength   Overall Strength Comments not tested resistively - WFL's for all activities       Flexibility   Hamstrings tight ~ 75-80 deg tighter Rt than Lt     Quadriceps tighter Rt than Lt     ITB tight bilat Rt > Lt     Piriformis tight Lt > Rt       Palpation   Spinal mobility pain with CPA mobs ~ L3/4 area; tightness Lt > Rt coccyx with abnormal alignment of coccyx - tipped in     SI assessment  lower pelvis on Rt with lower fold Rt than Lt     Palpation comment tight hip flexors; lats; QL; multifidi       Special Tests   Other special tests Thomas test hip flexor tightness bilat Rt > Lt       Ambulation/Gait   Gait Comments abnormal gait pattern with trunk laterally flexed to Rt and forward                        Objective measurements completed on examination: See above findings.       [facility] Adult PT Treatment/Exercise - 11/10/19 0001      Knee/Hip Exercises: Stretches   Hip Flexor Stretch Right;Left;2 reps;30 seconds   thomas test postion - LE off edge of table    Piriformis Stretch Right;Left;2 reps;30 seconds   supine travell with strap                  PT Education - 11/10/19 1604    Education Details HEP POC DN Posture    Person(s) Educated Patient    Methods Explanation;Demonstration;Tactile cues;Verbal cues;Handout    Comprehension Verbalized understanding;Returned demonstration;Verbal cues required;Tactile cues required               PT Long Term Goals - 11/10/19 1702      PT  LONG TERM GOAL #1   Title Improve posture and alignment with patient to demonstrate more upright posture    Time 6    Period Weeks    Status New    Target Date 12/22/19      PT LONG TERM GOAL #2   Title Improve ROM and mobility through the hips(to neutral hip extension) and lumbar spine(by 20-25%)    Time 6     Period Weeks    Status New    Target Date 12/22/19      PT LONG TERM GOAL #3   Title Patient to report increased tolerance for standing; walking and functional activities    Time 6    Period Weeks    Status New    Target Date 12/22/19      PT LONG TERM GOAL #4   Title Independent in HEP    Time 6    Period Weeks    Status New    Target Date 12/22/19      PT LONG TERM GOAL #5   Baseline Improve FOTO to </= 40% limitation    Time 6    Period Weeks    Status New    Target Date 12/22/19                  Plan - 11/10/19 1646    Clinical Impression Statement Patient presents with chronic LBP x 36 years with no known specific injury - likely related to [employer] r[employer] plane jumps. He underwent lumbar L4/5 fusion ~ 8 yrs ago with no change in pain. Patient's wife reports that he is bending to the side and forward now when he walks - (for the past couple years). Patient has abnormal posture with trunk laterally flexed to the Rt and forward at hips: he has asymmetrical pelvic alignment; coccyx tightness; rectus diastasis; tight hips; limited and painful lumbar ROM; muscular tightness through hip flexors/lumbar and posterior hip musculatrue; pain with standing/walking/bending/functional activities. Patient will benefit from PT to address problems identified. Discussed treatment and expectations of treatment of chronic pain.    Personal Factors and Comorbidities Comorbidity 1;Comorbidity 2    Comorbidities chronic pain; HTN    Examination-Activity Limitations Locomotion Level;Stand;Stairs;Squat;Bend;Lift    Examination-Participation Restrictions Other    Stability/Clinical Decision Making Stable/Uncomplicated    Clinical Decision Making Low    Rehab Potential Fair    PT Frequency 2x / week    PT Duration 6 weeks    PT Treatment/Interventions Patient/family education;ADLs/Self Care Home Management;Cryotherapy;Electrical Stimulation;Iontophoresis 4mg /ml Dexamethasone;Moist  Heat;Aquatic Therapy;Traction;Ultrasound;Gait training;Stair training;Functional mobility training;Therapeutic activities;Therapeutic exercise;Balance training;Neuromuscular re-education;Manual techniques;Dry needling;Taping    PT Next Visit Plan review HEP; further assessment as indicated; progress with stretching for hip flexors/lats/QL; continue education; add core stabilization as indicated; manual work vs DN; modalities as indicated    PT Home Exercise Plan [email or web address]    Consulted and Agree with Plan of Care Patient           Patient will benefit from skilled therapeutic intervention in order to improve the following deficits and impairments:  Abnormal gait, Decreased range of motion, Increased fascial restricitons, Increased muscle spasms, Decreased activity tolerance, Pain, Impaired flexibility, Improper body mechanics, Decreased mobility, Decreased strength, Postural dysfunction  Visit Diagnosis: Chronic bilateral low back pain without sciatica - Plan: PT plan of care cert/re-cert  Abnormal posture - Plan: PT plan of care cert/re-cert  Other symptoms and signs involving the musculoskeletal system - Plan: PT plan of care cert/re-cert  Other abnormalities of  gait and mobility - Plan: PT plan of care cert/re-cert     Problem List Patient Active Problem List   Diagnosis Date Noted  . Essential hypertension 10/15/2019  . Hypothyroidism 10/15/2019  . HLD (hyperlipidemia) 10/15/2019  . Chronic low back pain 10/15/2019  . Tinea pedis 10/15/2019  . Well adult exam 10/15/2019  . Neoplasm of uncertain behavior 22/84/0698  . Neck pain 09/25/2019    Sharah Finnell Nilda Simmer PT, MPH  11/10/2019, 5:10 PM  [facility] [place], [place], [address] Phone: [phone]   Fax:  [phone]  Name: Slyvester Latona MRN: [id number] Date of Birth: 08-09-1960

## 2019-11-10 NOTE — Patient Instructions (Signed)
Access Code: [email or web address]: [email or web address]: 06/28/2021Prepared by: Juliauna Stueve HoltExercises  Supine Piriformis Stretch with Leg Straight - 2 x daily - 7 x weekly - 1 sets - 3 reps - 30 sec hold  Hip Flexor Stretch at Edge of Bed - 2 x daily - 7 x weekly - 1 sets - 3 reps - 30 sec hold Patient Education  Biomedical scientist  Trigger Point Dry Needling

## 2019-11-13 ENCOUNTER — Encounter: Payer: BC Managed Care – PPO | Admitting: Rehabilitative and Restorative Service Providers"

## 2019-11-20 ENCOUNTER — Ambulatory Visit (INDEPENDENT_AMBULATORY_CARE_PROVIDER_SITE_OTHER): Payer: BC Managed Care – PPO | Admitting: Rehabilitative and Restorative Service Providers"

## 2019-11-20 ENCOUNTER — Other Ambulatory Visit: Payer: Self-pay

## 2019-11-20 ENCOUNTER — Encounter: Payer: Self-pay | Admitting: Rehabilitative and Restorative Service Providers"

## 2019-11-20 DIAGNOSIS — M545 Low back pain, unspecified: Secondary | ICD-10-CM

## 2019-11-20 DIAGNOSIS — R2689 Other abnormalities of gait and mobility: Secondary | ICD-10-CM | POA: Diagnosis not present

## 2019-11-20 DIAGNOSIS — R29898 Other symptoms and signs involving the musculoskeletal system: Secondary | ICD-10-CM

## 2019-11-20 DIAGNOSIS — G8929 Other chronic pain: Secondary | ICD-10-CM

## 2019-11-20 DIAGNOSIS — R293 Abnormal posture: Secondary | ICD-10-CM

## 2019-11-20 NOTE — Therapy (Signed)
[facility] [place], [place], [address] Phone: [phone]   Fax:  [phone]  Physical Therapy Treatment  Patient Details  Name: Brian Russell MRN: [id number] Date of Birth: [birth year] Referring Provider (PT): Dr Dianah Field    Encounter Date: 11/20/2019   PT End of Session - 11/20/19 1616    Visit Number 2    Number of Visits 12    Date for PT Re-Evaluation 12/22/19    PT Start Time 3267    PT Stop Time 1700    PT Time Calculation (min) 45 min    Activity Tolerance Patient tolerated treatment well           Past Medical History:  Diagnosis Date  . Hypertension   . Low thyroid stimulating hormone (TSH) level     Past Surgical History:  Procedure Laterality Date  . fractured bone    . JOINT REPLACEMENT    . SPINAL FUSION      There were no vitals filed for this visit.   Subjective Assessment - 11/20/19 1616    Subjective No change in LBP. He abused the back - home remodeling project.    Currently in Pain? Yes    Pain Score 5     Pain Location Back    Pain Orientation Right;Left;Lower    Pain Descriptors / Indicators Dull    Pain Type Chronic pain    Pain Onset More than a month ago    Pain Frequency Intermittent                             [facility] Adult PT Treatment/Exercise - 11/20/19 0001      Therapeutic Activites    Therapeutic Activities --   myofacial ball release work prone and supine      Knee/Hip Exercises: Stretches   Hip Flexor Stretch Right;Left;3 reps;30 seconds   thomas test postion - LE off edge of table    Piriformis Stretch Right;Left;3 reps;30 seconds   supine travell with strap    Other Knee/Hip Stretches lat stretch supine PT assist for LE's 30 sec hold x 3 reps       Moist Heat Therapy   Number Minutes Moist Heat 10 Minutes    Moist Heat Location Lumbar Spine      Electrical Stimulation   Electrical Stimulation Location bilat lumbar  paraspinals/QL    Electrical Stimulation Action TENS     Electrical Stimulation Parameters to tolerance     Electrical Stimulation Goals Pain;Tone      Manual Therapy   Joint Mobilization lumbar and sacral PA mobs Grade II    Soft tissue mobilization deep tissue work through bilat lumbar paraspinals to QL     Myofascial Release lumbar spine                   PT Education - 11/20/19 1643    Education Details HEP myofacial ball release work; TENS: DN    Person(s) Educated Patient    Methods Explanation;Demonstration;Tactile cues;Verbal cues;Handout    Comprehension Verbalized understanding;Returned demonstration;Verbal cues required;Tactile cues required               PT Long Term Goals - 11/10/19 1702      PT LONG TERM GOAL #1   Title Improve posture and alignment with patient to demonstrate more upright posture    Time 6    Period Weeks  Status New    Target Date 12/22/19      PT LONG TERM GOAL #2   Title Improve ROM and mobility through the hips(to neutral hip extension) and lumbar spine(by 20-25%)    Time 6    Period Weeks    Status New    Target Date 12/22/19      PT LONG TERM GOAL #3   Title Patient to report increased tolerance for standing; walking and functional activities    Time 6    Period Weeks    Status New    Target Date 12/22/19      PT LONG TERM GOAL #4   Title Independent in HEP    Time 6    Period Weeks    Status New    Target Date 12/22/19      PT LONG TERM GOAL #5   Baseline Improve FOTO to </= 40% limitation    Time 6    Period Weeks    Status New    Target Date 12/22/19                 Plan - 11/20/19 1630    Clinical Impression Statement Working on exercises twice a day at home. Continues to irritate LBP with activities at home. Very tight through the hip flexors. Added lat stretch and deep tissue work and myofacial ball release work.    Rehab Potential Fair    PT Frequency 2x / week    PT Duration 6 weeks     PT Treatment/Interventions Patient/family education;ADLs/Self Care Home Management;Cryotherapy;Electrical Stimulation;Iontophoresis 4mg /ml Dexamethasone;Moist Heat;Aquatic Therapy;Traction;Ultrasound;Gait training;Stair training;Functional mobility training;Therapeutic activities;Therapeutic exercise;Balance training;Neuromuscular re-education;Manual techniques;Dry needling;Taping    PT Next Visit Plan review HEP; further assessment as indicated; progress with stretching for hip flexors/lats/QL; continue education; add core stabilization as indicated; manual work vs DN; modalities as indicated - trial of DN to QL bilat    PT Home Exercise Plan [email or web address]; VHI    Consulted and Agree with Plan of Care Patient           Patient will benefit from skilled therapeutic intervention in order to improve the following deficits and impairments:     Visit Diagnosis: Chronic bilateral low back pain without sciatica  Abnormal posture  Other symptoms and signs involving the musculoskeletal system  Other abnormalities of gait and mobility     Problem List Patient Active Problem List   Diagnosis Date Noted  . Essential hypertension 10/15/2019  . Hypothyroidism 10/15/2019  . HLD (hyperlipidemia) 10/15/2019  . Chronic low back pain 10/15/2019  . Tinea pedis 10/15/2019  . Well adult exam 10/15/2019  . Neoplasm of uncertain behavior 40/12/6759  . Neck pain 09/25/2019    Bradey Luzier Nilda Simmer PT, MPH  11/20/2019, 4:59 PM  [facility] [place], [place], [address] Phone: [phone]   Fax:  [phone]  Name: Brian Russell MRN: [id number] Date of Birth: December 11, 1960

## 2019-11-20 NOTE — Patient Instructions (Addendum)
   Latisimus dorsi stretch   Lying on back Bring both knees to chest and prop of hold  Turn palms toward face Push hands up over your head   30 - 60 second hold x 3 reps    Myofacial ball release work  ~ Herbalist Dry Needling  . What is Trigger Point Dry Needling (DN)? o DN is a physical therapy technique used to treat muscle pain and dysfunction. Specifically, DN helps deactivate muscle trigger points (muscle knots).  o A thin filiform needle is used to penetrate the skin and stimulate the underlying trigger point. The goal is for a local twitch response (LTR) to occur and for the trigger point to relax. No medication of any kind is injected during the procedure.   . What Does Trigger Point Dry Needling Feel Like?  o The procedure feels different for each individual patient. Some patients report that they do not actually feel the needle enter the skin and overall the process is not painful. Very mild bleeding may occur. However, many patients feel a deep cramping in the muscle in which the needle was inserted. This is the local twitch response.   Marland Kitchen How Will I feel after the treatment? o Soreness is normal, and the onset of soreness may not occur for a few hours. Typically this soreness does not last longer than two days.  o Bruising is uncommon, however; ice can be used to decrease any possible bruising.  o In rare cases feeling tired or nauseous after the treatment is normal. In addition, your symptoms may get worse before they get better, this period will typically not last longer than 24 hours.   . What Can I do After My Treatment? o Increase your hydration by drinking more water for the next 24 hours. o You may place ice or heat on the areas treated that have become sore, however, do not use heat on inflamed or bruised areas. Heat often brings more relief post needling. o You can continue your regular activities, but vigorous activity is not recommended  initially after the treatment for 24 hours. o DN is best combined with other physical therapy such as strengthening, stretching, and other therapies.    TENS UNIT: This is helpful for muscle pain and spasm.   Search and Purchase a TENS 7000 2nd edition at www.tenspros.com. It should be less than $30.     TENS unit instructions: Do not shower or bathe with the unit on Turn the unit off before removing electrodes or batteries If the electrodes lose stickiness add a drop of water to the electrodes after they are disconnected from the unit and place on plastic sheet. If you continued to have difficulty, call the TENS unit company to purchase more electrodes. Do not apply lotion on the skin area prior to use. Make sure the skin is clean and dry as this will help prolong the life of the electrodes. After use, always check skin for unusual red areas, rash or other skin difficulties. If there are any skin problems, does not apply electrodes to the same area. Never remove the electrodes from the unit by pulling the wires. Do not use the TENS unit or electrodes other than as directed. Do not change electrode placement without consultating your therapist or physician. Keep 2 fingers with between each electrode.

## 2019-11-24 ENCOUNTER — Encounter: Payer: BC Managed Care – PPO | Admitting: Rehabilitative and Restorative Service Providers"

## 2019-11-26 ENCOUNTER — Ambulatory Visit (INDEPENDENT_AMBULATORY_CARE_PROVIDER_SITE_OTHER): Payer: BC Managed Care – PPO | Admitting: Rehabilitative and Restorative Service Providers"

## 2019-11-26 ENCOUNTER — Other Ambulatory Visit: Payer: Self-pay

## 2019-11-26 ENCOUNTER — Encounter: Payer: Self-pay | Admitting: Rehabilitative and Restorative Service Providers"

## 2019-11-26 DIAGNOSIS — R2689 Other abnormalities of gait and mobility: Secondary | ICD-10-CM

## 2019-11-26 DIAGNOSIS — M545 Low back pain, unspecified: Secondary | ICD-10-CM

## 2019-11-26 DIAGNOSIS — R293 Abnormal posture: Secondary | ICD-10-CM

## 2019-11-26 DIAGNOSIS — G8929 Other chronic pain: Secondary | ICD-10-CM

## 2019-11-26 DIAGNOSIS — R29898 Other symptoms and signs involving the musculoskeletal system: Secondary | ICD-10-CM

## 2019-11-26 NOTE — Therapy (Addendum)
[place], [place], [address] Phone: [phone]   Fax:  [phone]  Physical Therapy Treatment  Patient Details  Name: Brian Russell MRN: [id number] Date of Birth: March 23, 1961 Referring Provider (PT): Dr Dianah Field    Encounter Date: 11/26/2019   PT End of Session - 11/26/19 1523    Visit Number 3    Number of Visits 12    Date for PT Re-Evaluation 12/22/19    PT Start Time 1518    PT Stop Time 1612    PT Time Calculation (min) 54 min    Activity Tolerance Patient tolerated treatment well           Past Medical History:  Diagnosis Date  . Hypertension   . Low thyroid stimulating hormone (TSH) level     Past Surgical History:  Procedure Laterality Date  . fractured bone    . JOINT REPLACEMENT    . SPINAL FUSION      There were no vitals filed for this visit.   Subjective Assessment - 11/26/19 1523    Subjective Patient reports that his back is the same. He has been working on [employer] daily without difficulty.    Pertinent History lumbar surgery for treatment of LBP with rods and screws in L4/L5 ~ 8 yrs ago no change in symptoms (Dr Juliene Pina - [facility]) DDD L3/4/5; HTN    Currently in Pain? Yes    Pain Score 5     Pain Location Back    Pain Orientation Right;Left;Lower    Pain Descriptors / Indicators Dull    Pain Type Chronic pain    Pain Onset More than a month ago    Pain Frequency Intermittent    Aggravating Factors  standing; walking; bending    Pain Relieving Factors sitting; lying down; moving                             [facility] Adult PT Treatment/Exercise - 11/26/19 0001      Lumbar Exercises: Seated   Other Seated Lumbar Exercises cat camel seated working on lumbar mobility x 5 surface elevated to position knees lower than hips       Lumbar Exercises: Quadruped   Madcat/Old Horse 10 reps   5-10 sec hold      Knee/Hip Exercises: Stretches     Hip Flexor Stretch Right;Left;3 reps;30 seconds   thomas test postion - LE off edge of table    Piriformis Stretch Right;Left;3 reps;30 seconds   supine travell with strap    Other Knee/Hip Stretches lat stretch supine PT assist for LE's 30 sec hold x 3 reps       Moist Heat Therapy   Number Minutes Moist Heat 15 Minutes    Moist Heat Location Lumbar Spine      Electrical Stimulation   Electrical Stimulation Location bilat lumbar paraspinals/QL    Electrical Stimulation Action IFC    Electrical Stimulation Parameters to tolerance    Electrical Stimulation Goals Pain;Tone      Manual Therapy   Manual therapy comments skilled palpation to assess tissue response to DN and manual work     Joint Mobilization lumbar and sacral PA mobs Grade II    Soft tissue mobilization deep tissue work through bilat lumbar paraspinals to QL     Myofascial Release lumbar spine             Trigger  Point Dry Needling - 11/26/19 0001    Consent Given? Yes    Education Handout Provided Yes    Other Dry Needling bilat pt prone and sidelying     Lumbar multifidi Response Palpable increased muscle length    Quadratus Lumborum Response Palpable increased muscle length                PT Education - 11/26/19 1556    Education Details HEP    Person(s) Educated Patient    Methods Explanation;Demonstration;Tactile cues;Verbal cues;Handout    Comprehension Verbalized understanding;Returned demonstration;Verbal cues required;Tactile cues required               PT Long Term Goals - 11/10/19 1702      PT LONG TERM GOAL #1   Title Improve posture and alignment with patient to demonstrate more upright posture    Time 6    Period Weeks    Status New    Target Date 12/22/19      PT LONG TERM GOAL #2   Title Improve ROM and mobility through the hips(to neutral hip extension) and lumbar spine(by 20-25%)    Time 6    Period Weeks    Status New    Target Date 12/22/19      PT LONG TERM GOAL  #3   Title Patient to report increased tolerance for standing; walking and functional activities    Time 6    Period Weeks    Status New    Target Date 12/22/19      PT LONG TERM GOAL #4   Title Independent in HEP    Time 6    Period Weeks    Status New    Target Date 12/22/19      PT LONG TERM GOAL #5   Baseline Improve FOTO to </= 40% limitation    Time 6    Period Weeks    Status New    Target Date 12/22/19                 Plan - 11/26/19 1550    Clinical Impression Statement Significant limitations in segmental trunk mobility/ROM; significant tightness in QL and lumbar paraspinals. Difficulty with release of QL with DN and manual work - continued muscular tightness post treatment. Added segmental lumbar mobility exercises in qudraped and sitting.    Rehab Potential Fair    PT Frequency 2x / week    PT Treatment/Interventions Patient/family education;ADLs/Self Care Home Management;Cryotherapy;Electrical Stimulation;Iontophoresis 76m/ml Dexamethasone;Moist Heat;Aquatic Therapy;Traction;Ultrasound;Gait training;Stair training;Functional mobility training;Therapeutic activities;Therapeutic exercise;Balance training;Neuromuscular re-education;Manual techniques;Dry needling;Taping    PT Next Visit Plan review HEP; further assessment as indicated; progress with stretching for hip flexors/lats/QL; continue education; add core stabilization as indicated; manual work vs DN; modalities as indicated - assess response to trial of DN to QL bilat    PT Home Exercise Plan [email or web address]; VHI    Consulted and Agree with Plan of Care Patient           Patient will benefit from skilled therapeutic intervention in order to improve the following deficits and impairments:     Visit Diagnosis: Chronic bilateral low back pain without sciatica  Abnormal posture  Other symptoms and signs involving the musculoskeletal system  Other abnormalities of gait and mobility     Problem  List Patient Active Problem List   Diagnosis Date Noted  . Essential hypertension 10/15/2019  . Hypothyroidism 10/15/2019  . HLD (hyperlipidemia) 10/15/2019  . Chronic low back pain 10/15/2019  .  Tinea pedis 10/15/2019  . Well adult exam 10/15/2019  . Neoplasm of uncertain behavior 38/33/3832  . Neck pain 09/25/2019    Emryn Flanery Nilda Simmer PT, MPH  11/26/2019, 4:11 PM  [facility] [place], [place], [address] Phone: [phone]   Fax:  [phone]  Name: Brian Russell MRN: [id number] Date of Birth: 01/27/1961  PHYSICAL THERAPY DISCHARGE SUMMARY  Visits from Start of Care: 3  Current functional level related to goals / functional outcomes: See last progress note for discharge status    Remaining deficits: Unknown    Education / Equipment: HEP  Plan: Patient agrees to discharge.  Patient goals were partially met. Patient is being discharged due to not returning since the last visit.  ?????     Makaela Cando P. Helene Kelp PT, MPH 01/07/20 9:45 AM

## 2019-11-26 NOTE — Patient Instructions (Signed)
Access Code: [email or web address]: [email or web address]: 07/14/2021Prepared by: Ramla Hase HoltExercises  Supine Piriformis Stretch with Leg Straight - 2 x daily - 7 x weekly - 1 sets - 3 reps - 30 sec hold  Hip Flexor Stretch at Edge of Bed - 2 x daily - 7 x weekly - 1 sets - 3 reps - 30 sec hold  Cat-Camel - 2 x daily - 7 x weekly - 1 sets - 5-10 reps - 5-10 sec hold  Seated Cat Camel - 2 x daily - 7 x weekly - 1 sets - 5-10 reps - 5-10 sec hold

## 2019-12-04 ENCOUNTER — Encounter: Payer: BC Managed Care – PPO | Admitting: Rehabilitative and Restorative Service Providers"

## 2019-12-08 ENCOUNTER — Encounter: Payer: BC Managed Care – PPO | Admitting: Rehabilitative and Restorative Service Providers"

## 2019-12-10 ENCOUNTER — Encounter: Payer: Self-pay | Admitting: Sports Medicine

## 2019-12-10 ENCOUNTER — Ambulatory Visit (INDEPENDENT_AMBULATORY_CARE_PROVIDER_SITE_OTHER): Payer: BC Managed Care – PPO | Admitting: Sports Medicine

## 2019-12-10 ENCOUNTER — Other Ambulatory Visit: Payer: Self-pay

## 2019-12-10 DIAGNOSIS — M4807 Spinal stenosis, lumbosacral region: Secondary | ICD-10-CM

## 2019-12-10 DIAGNOSIS — G8929 Other chronic pain: Secondary | ICD-10-CM | POA: Diagnosis not present

## 2019-12-10 DIAGNOSIS — M545 Low back pain, unspecified: Secondary | ICD-10-CM

## 2019-12-10 DIAGNOSIS — M47812 Spondylosis without myelopathy or radiculopathy, cervical region: Secondary | ICD-10-CM | POA: Insufficient documentation

## 2019-12-10 NOTE — Progress Notes (Signed)
    Procedures performed today:    None.  Independent interpretation of notes and tests performed by another provider:   None.  Brief History, Exam, Impression, and Recommendations:    Chronic low back pain Brian Russell returns, he has a long history of low back pain and is post L4-L5 posterior lumbar interbody fusion, symptoms are consistent with lumbar spinal stenosis, better with flexion of the spine. He is interested in aggressive treatment but no surgery. At this point he is not getting relief with physical therapy, meloxicam, gabapentin 800 mg at night. Proceeding with MRI for interventional planning. Pain is midline, nothing radicular. Tramadol is ineffective, no interest in hydrocodone.  Cervical spondylosis Improved considerably with meloxicam, gabapentin.    ___________________________________________ Gwen Her. Dianah Field, M.D., ABFM., CAQSM. Primary Care and [place] Instructor of [place] [employer] [facility] [employer] [facility]

## 2019-12-10 NOTE — Assessment & Plan Note (Signed)
Improved considerably with meloxicam, gabapentin.

## 2019-12-10 NOTE — Assessment & Plan Note (Addendum)
Brian Russell returns, he has a long history of low back pain and is post L4-L5 posterior lumbar interbody fusion, symptoms are consistent with lumbar spinal stenosis, better with flexion of the spine. He is interested in aggressive treatment but no surgery. At this point he is not getting relief with physical therapy, meloxicam, gabapentin 800 mg at night. Proceeding with MRI for interventional planning. Pain is midline, nothing radicular. Tramadol is ineffective, no interest in hydrocodone.

## 2019-12-11 ENCOUNTER — Encounter: Payer: BC Managed Care – PPO | Admitting: Physical Therapy

## 2019-12-11 ENCOUNTER — Telehealth: Payer: Self-pay | Admitting: Physical Therapy

## 2019-12-11 NOTE — Telephone Encounter (Signed)
Patient did not show for PT appt. Called and spoke to patient on phone regarding missed appt.  He informed me that he will be having a MRI on Sunday and may also be getting an injection.  When asked if he wanted to cancel upcoming PT appt next week, he stated no. He is interested in hearing about results before making a decision about appt.   Confirmed time of next appt.    Kerin Perna, PTA 12/11/19 4:44 PM

## 2019-12-14 ENCOUNTER — Other Ambulatory Visit: Payer: Self-pay

## 2019-12-14 ENCOUNTER — Ambulatory Visit (INDEPENDENT_AMBULATORY_CARE_PROVIDER_SITE_OTHER): Payer: BC Managed Care – PPO

## 2019-12-14 DIAGNOSIS — M4316 Spondylolisthesis, lumbar region: Secondary | ICD-10-CM | POA: Diagnosis not present

## 2019-12-14 DIAGNOSIS — M5126 Other intervertebral disc displacement, lumbar region: Secondary | ICD-10-CM | POA: Diagnosis not present

## 2019-12-14 DIAGNOSIS — G8929 Other chronic pain: Secondary | ICD-10-CM

## 2019-12-14 DIAGNOSIS — M545 Low back pain: Secondary | ICD-10-CM | POA: Diagnosis not present

## 2019-12-14 DIAGNOSIS — M4807 Spinal stenosis, lumbosacral region: Secondary | ICD-10-CM

## 2019-12-14 DIAGNOSIS — M47816 Spondylosis without myelopathy or radiculopathy, lumbar region: Secondary | ICD-10-CM | POA: Diagnosis not present

## 2019-12-14 DIAGNOSIS — M48061 Spinal stenosis, lumbar region without neurogenic claudication: Secondary | ICD-10-CM | POA: Diagnosis not present

## 2019-12-14 IMAGING — MR MR LUMBAR SPINE W/O CM
4 of 5 series · 26 of 48 positions shown · non-contrast
Comparison: Prior radiograph from [DATE].

CLINICAL DATA: Initial evaluation for spinal stenosis, history of
prior L4-5 PLIF.

EXAM:
MRI LUMBAR SPINE WITHOUT CONTRAST
TECHNIQUE: Multiplanar, multisequence MR imaging of the lumbar spine was
performed. No intravenous contrast was administered.

[Series 6: T2 · sagittal · 4.0mm · 0.81mm/px · 6 of 15 slices shown (1 of 2)]
[im 1/15]
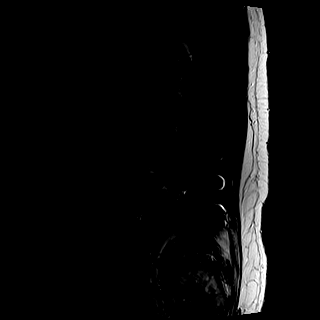
[im 3/15]
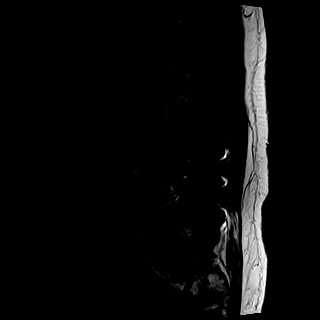
[im 6/15]
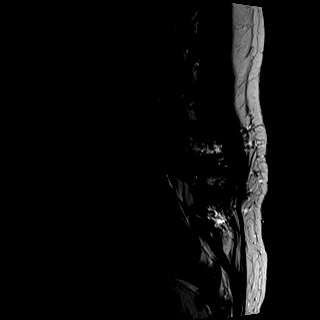
[im 9/15]
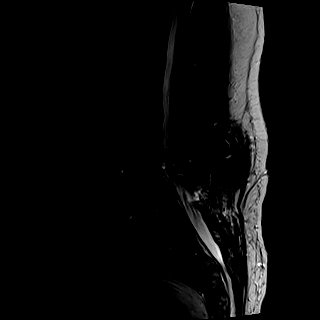
[im 12/15]
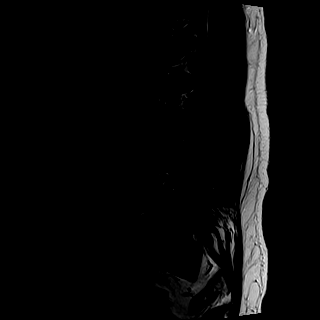
[im 15/15]
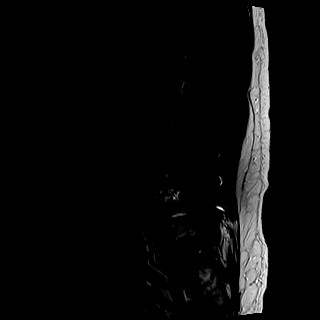

[Series 7: T1 · sagittal · 4.0mm · 0.41mm/px · 5 of 15 slices shown (1 of 2)]
[im 1/15]
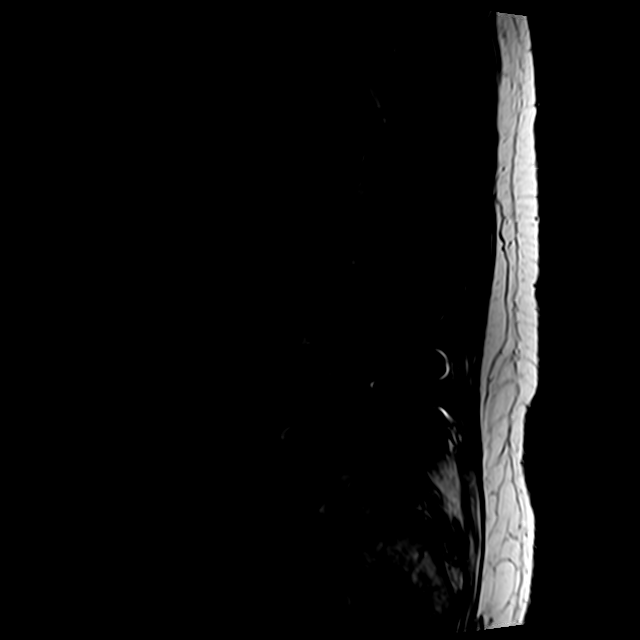
[im 4/15]
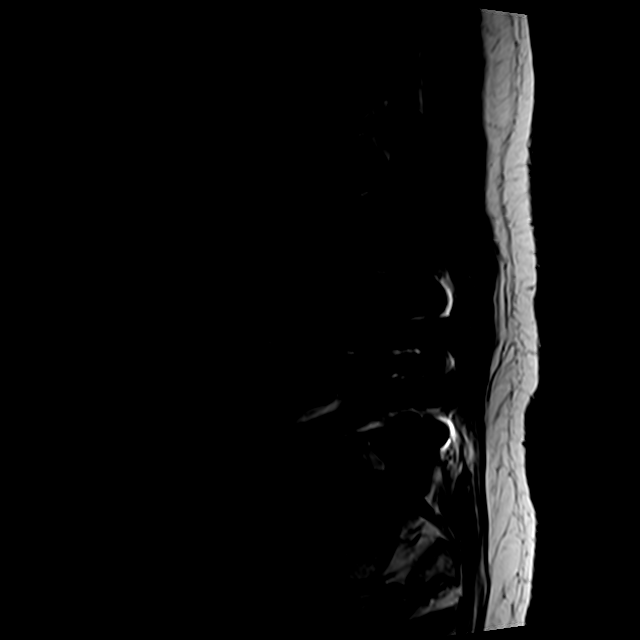
[im 8/15]
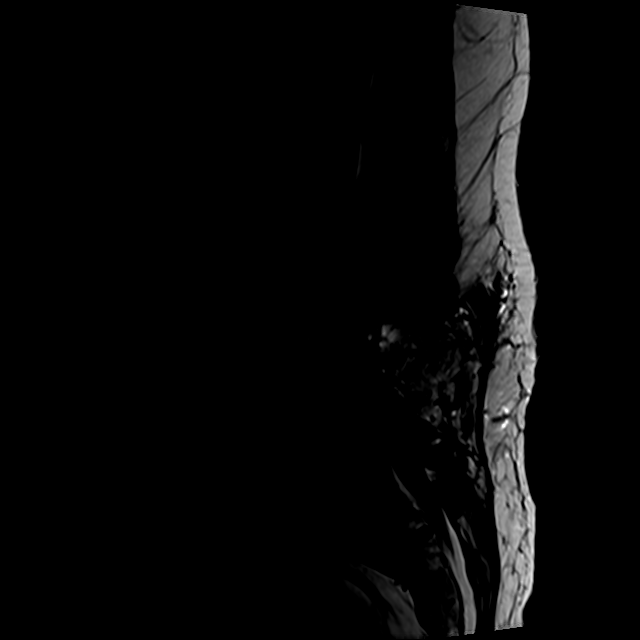
[im 11/15]
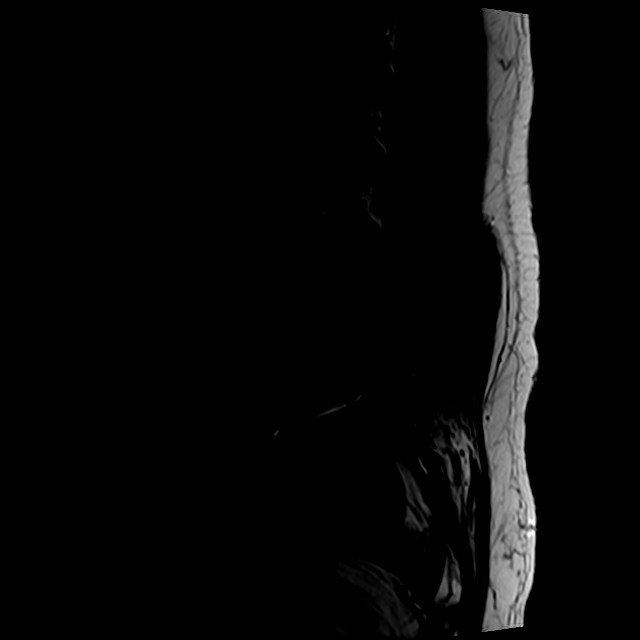
[im 15/15]
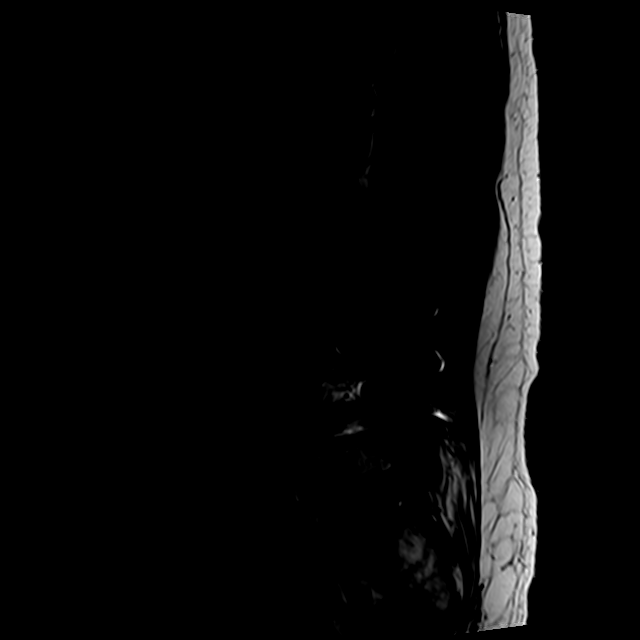

[Series 11: T2 · axial · 4.0mm · 0.78mm/px · z∈[-151,+94]mm · 10 of 44 slices shown (2 of 2)]
[im 3/44]
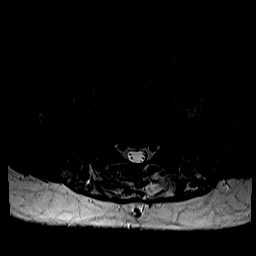
[im 6/44]
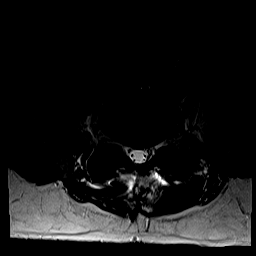
[im 9/44]
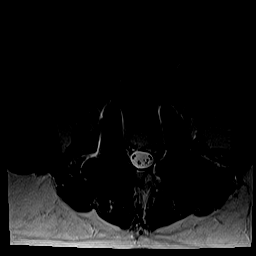
[im 15/44]
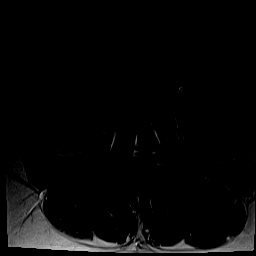
[im 21/44]
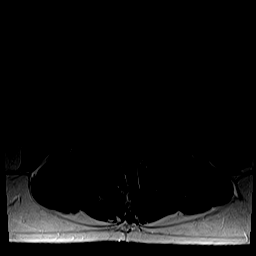
[im 23/44]
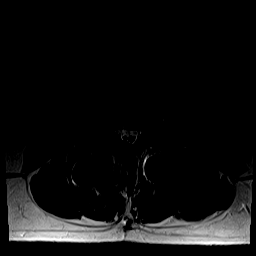
[im 26/44]
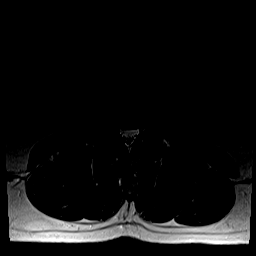
[im 32/44]
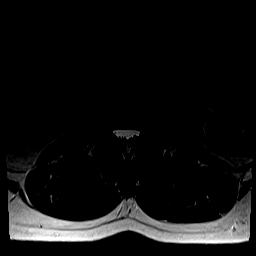
[im 38/44]
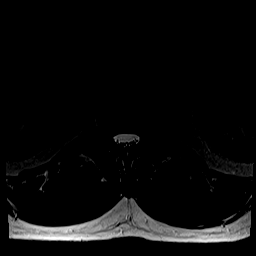
[im 44/44]
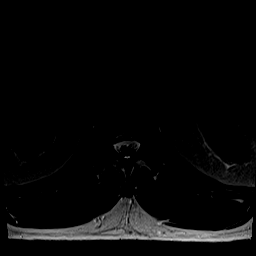

[Series 14: T1 · axial · 4.0mm · 0.39mm/px · z∈[-151,+64]mm · 5 of 44 slices shown (2 of 2)]
[im 3/44]
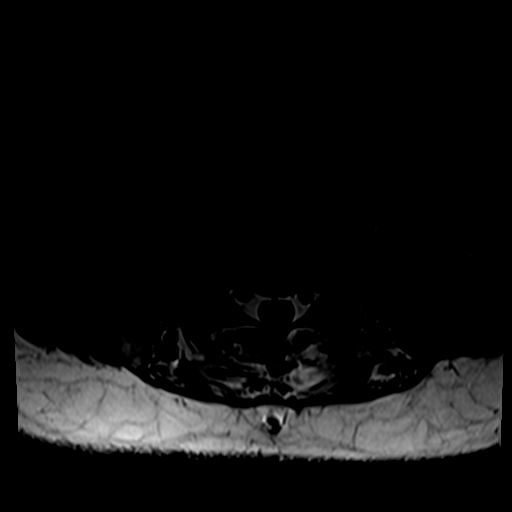
[im 6/44]
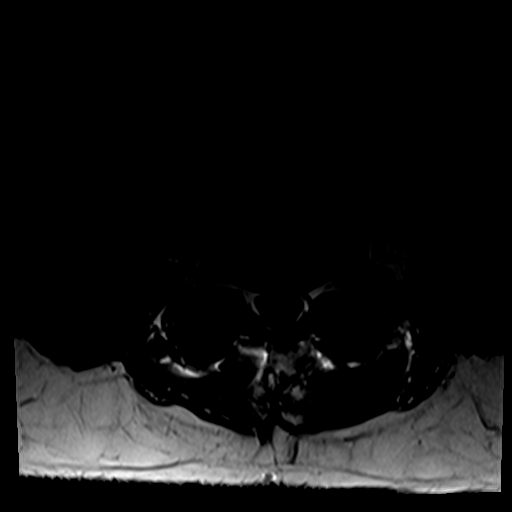
[im 9/44]
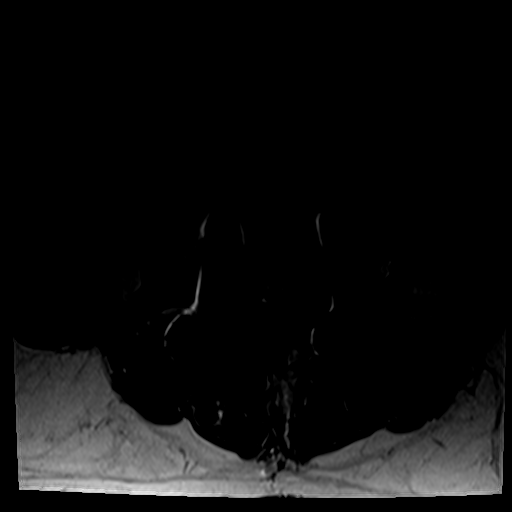
[im 23/44]
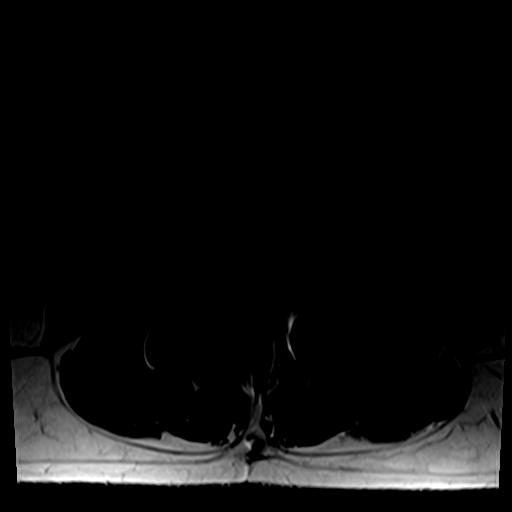
[im 38/44]
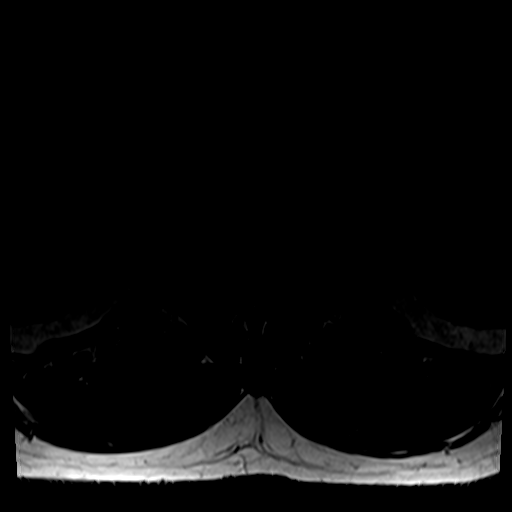

[26 of 48 positions shown; findings below may reference images not displayed]

FINDINGS: Segmentation: Standard. Lowest well-formed disc space labeled the
L5-S1 level.

Alignment: Trace 3 mm anterolisthesis of L3 on L4. Alignment
otherwise normal with preservation of the normal lumbar lordosis.

Vertebrae: Susceptibility artifact related to prior PLIF noted at
L4-5. Vertebral body height maintained without evidence for acute or
chronic fracture. Underlying bone marrow signal intensity within
normal limits. No discrete or worrisome osseous lesions. Discogenic
reactive endplate changes present about the L2-3 and L3-4
interspaces. No other abnormal marrow edema.

Conus medullaris and cauda equina: Conus extends to the T12-L1
level. Conus and cauda equina appear normal.

Paraspinal and other soft tissues: Postoperative scarring present
within the lower posterior paraspinous soft tissues. No acute
paraspinous soft tissue abnormality. Visualized visceral structures
within normal limits.

Disc levels:

T12-L1: Unremarkable.

L1-2: Disc desiccation with minimal annular disc bulge, slightly
asymmetric to the left. No spinal stenosis. Mild left L1 foraminal
narrowing. No significant right foraminal encroachment.

L2-3: Chronic intervertebral disc space narrowing with diffuse disc
bulge and disc desiccation. Mild bilateral facet hypertrophy with
associated trace joint fluid. Resultant mild narrowing of the
lateral recesses bilaterally. Central canal remains patent. Mild
left L2 foraminal stenosis. No significant right foraminal
narrowing.

L3-4: Trace anterolisthesis. Chronic intervertebral disc space
narrowing with diffuse disc bulge and disc desiccation. Central
annular fissure. Moderate facet and ligament flavum hypertrophy with
associated small joint effusions. Resultant severe canal with
bilateral subarticular stenosis. Thecal sac measures 5-6 mm in
transverse diameter at this level. Moderate bilateral L3 foraminal
stenosis.

L4-5: Postoperative changes from prior PLIF. Thecal sac remains
widely patent. Foramina appear patent as well.

L5-S1: Negative interspace. Moderate right worse than left facet
hypertrophy. No canal or lateral recess stenosis. Foramina remain
patent.
IMPRESSION: 1. Postoperative changes from prior PLIF at L4-5 without residual or
recurrent stenosis.
2. Adjacent segment disease with disc bulge and facet hypertrophy at
L3-4 with resultant severe canal and bilateral subarticular
stenosis, with moderate bilateral L3 foraminal narrowing.
3. Mild noncompressive disc bulging and facet hypertrophy at L1-2
and L2-3 with resultant mild left L1 and L2 foraminal stenosis.

## 2019-12-15 ENCOUNTER — Other Ambulatory Visit: Payer: Self-pay | Admitting: Sports Medicine

## 2019-12-15 DIAGNOSIS — M545 Low back pain, unspecified: Secondary | ICD-10-CM

## 2019-12-15 DIAGNOSIS — G8929 Other chronic pain: Secondary | ICD-10-CM

## 2019-12-16 ENCOUNTER — Other Ambulatory Visit: Payer: Self-pay | Admitting: Sports Medicine

## 2019-12-16 MED ORDER — TRAMADOL HCL 50 MG PO TABS: 50.0000 mg | ORAL_TABLET | Freq: Three times a day (TID) | ORAL | 0 refills | Status: DC | PRN

## 2019-12-16 MED ORDER — HYDROCODONE-ACETAMINOPHEN 5-325 MG PO TABS: 1.0000 | ORAL_TABLET | Freq: Three times a day (TID) | ORAL | 0 refills | Status: DC | PRN

## 2019-12-16 NOTE — Addendum Note (Signed)
Addended by: Silverio Decamp on: 12/16/2019 05:01 PM   Modules accepted: Orders

## 2019-12-18 ENCOUNTER — Encounter: Payer: BC Managed Care – PPO | Admitting: Rehabilitative and Restorative Service Providers"

## 2019-12-25 ENCOUNTER — Telehealth: Payer: Self-pay | Admitting: *Deleted

## 2019-12-25 NOTE — Telephone Encounter (Signed)
Called Office and spoke with Anitra to confirm that they did get the referral but it has not been processed yet. I did let patient know

## 2019-12-25 NOTE — Telephone Encounter (Signed)
Pt left me a vm this morning saying that he spoke with Dr. Isabelle Course office and he was told that they have not received the referral for his epidural.  Jenny Reichmann noted in the chart that she spoke to someone in his office and confirmed that they DID get it so I don't know what is going on.  Can you look into this and see what you can find out?

## 2019-12-29 ENCOUNTER — Other Ambulatory Visit: Payer: Self-pay

## 2019-12-29 ENCOUNTER — Other Ambulatory Visit (INDEPENDENT_AMBULATORY_CARE_PROVIDER_SITE_OTHER): Payer: BC Managed Care – PPO

## 2019-12-29 DIAGNOSIS — G8929 Other chronic pain: Secondary | ICD-10-CM | POA: Diagnosis not present

## 2019-12-29 DIAGNOSIS — M545 Low back pain, unspecified: Secondary | ICD-10-CM

## 2019-12-29 DIAGNOSIS — M47812 Spondylosis without myelopathy or radiculopathy, cervical region: Secondary | ICD-10-CM | POA: Diagnosis not present

## 2019-12-29 NOTE — Telephone Encounter (Signed)
Can wait for Dr. Darene Lamer to come back.

## 2019-12-29 NOTE — Telephone Encounter (Signed)
Brian Russell is requesting refills on Levothyroxine and Omeprazole, historical provider. He also needs a refill on the Hydrocodone, written by Dr Dianah Field. He is waiting to be scheduled with Dr Ezequiel Ganser office for an epidural. He only needs enough until his unknown future appointment.

## 2019-12-29 NOTE — Telephone Encounter (Signed)
Pt called requesting a refill of hydrocodone.

## 2019-12-30 MED ORDER — HYDROCODONE-ACETAMINOPHEN 5-325 MG PO TABS: 1.0000 | ORAL_TABLET | Freq: Three times a day (TID) | ORAL | 0 refills | Status: DC | PRN

## 2019-12-30 NOTE — Telephone Encounter (Signed)
Portal message sent to patient notifying him of Dr. Mcneil Sober response.

## 2019-12-30 NOTE — Telephone Encounter (Signed)
He specifically told me he had no interest in hydrocodone in the office visit.

## 2019-12-30 NOTE — Addendum Note (Signed)
Addended by: Silverio Decamp on: 12/30/2019 05:39 PM   Modules accepted: Orders

## 2019-12-31 MED ORDER — OMEPRAZOLE 40 MG PO CPDR: 40.0000 mg | DELAYED_RELEASE_CAPSULE | Freq: Every day | ORAL | 3 refills | Status: DC

## 2019-12-31 MED ORDER — LEVOTHYROXINE SODIUM 150 MCG PO TABS: 150.0000 ug | ORAL_TABLET | Freq: Every day | ORAL | 3 refills | Status: DC

## 2020-01-06 ENCOUNTER — Encounter: Payer: Self-pay | Admitting: Family Medicine

## 2020-01-06 ENCOUNTER — Ambulatory Visit (INDEPENDENT_AMBULATORY_CARE_PROVIDER_SITE_OTHER): Payer: BC Managed Care – PPO | Admitting: Family Medicine

## 2020-01-06 DIAGNOSIS — H6983 Other specified disorders of Eustachian tube, bilateral: Secondary | ICD-10-CM

## 2020-01-06 DIAGNOSIS — H6980 Other specified disorders of Eustachian tube, unspecified ear: Secondary | ICD-10-CM | POA: Insufficient documentation

## 2020-01-06 DIAGNOSIS — H6990 Unspecified Eustachian tube disorder, unspecified ear: Secondary | ICD-10-CM | POA: Insufficient documentation

## 2020-01-06 MED ORDER — FLUTICASONE PROPIONATE 50 MCG/ACT NA SUSP
2.0000 | Freq: Every day | NASAL | 6 refills | Status: DC
Start: 2020-01-06 — End: 2020-04-15

## 2020-01-06 NOTE — Progress Notes (Signed)
Brian Russell - 59 y.o. male MRN [id number]  Date of birth: June 02, 1960  Subjective Chief Complaint  Patient presents with  . Dizziness  . Tinnitus    HPI Brian Russell is a 59 y.o. male here today with complaint if ear fullness, tinnitus, dizziness and popping sensation of ears.  Current symptoms started about 1 week ago but has been occurring off and on for several months. He denies fever, congestion, nausea, ear pain.  He has not tried anything for treatment so far.    ROS:  A comprehensive ROS was completed and negative except as noted per HPI  Allergies  Allergen Reactions  . Amlodipine   . Buspar [Buspirone]     Past Medical History:  Diagnosis Date  . Hypertension   . Low thyroid stimulating hormone (TSH) level     Past Surgical History:  Procedure Laterality Date  . fractured bone    . JOINT REPLACEMENT    . SPINAL FUSION      Social History   Socioeconomic History  . Marital status: Married    Spouse name: Not on file  . Number of children: Not on file  . Years of education: Not on file  . Highest education level: Not on file  Occupational History  . Occupation: Product manager  Tobacco Use  . Smoking status: Former Smoker    Types: Cigarettes    Quit date: 05/2011    Years since quitting: 8.6  . Smokeless tobacco: Never Used  Vaping Use  . Vaping Use: Never used  Substance and Sexual Activity  . Alcohol use: Yes    Alcohol/week: 2.0 standard drinks    Types: 2 Standard drinks or equivalent per week  . Drug use: Never  . Sexual activity: Yes    Partners: Female  Other Topics Concern  . Not on file  Social History Narrative  . Not on file   Social Determinants of Health   Financial Resource Strain:   . Difficulty of Paying Living Expenses: Not on file  Food Insecurity:   . Worried About Charity fundraiser in the Last Year: Not on file  . Ran Out of Food in the Last Year: Not on file  Transportation Needs:   . Lack of  Transportation (Medical): Not on file  . Lack of Transportation (Non-Medical): Not on file  Physical Activity:   . Days of Exercise per Week: Not on file  . Minutes of Exercise per Session: Not on file  Stress:   . Feeling of Stress : Not on file  Social Connections:   . Frequency of Communication with Friends and Family: Not on file  . Frequency of Social Gatherings with Friends and Family: Not on file  . Attends Religious Services: Not on file  . Active Member of Clubs or Organizations: Not on file  . Attends Archivist Meetings: Not on file  . Marital Status: Not on file    Family History  Problem Relation Age of Onset  . Hypertension Other   . Heart attack Other   . Stroke Other     Health Maintenance  Topic Date Due  . Hepatitis C Screening  Never done  . COVID-19 Vaccine (1) Never done  . HIV Screening  Never done  . INFLUENZA VACCINE  12/14/2019  . TETANUS/TDAP  07/24/2021  . COLONOSCOPY  12/15/2027     ----------------------------------------------------------------------------------------------------------------------------------------------------------------------------------------------------------------- Physical Exam BP 125/86 (BP Location: Left Arm, Patient Position: Sitting, Cuff Size: Normal)  Pulse (!) 55   Temp 98.2 F (36.8 C) (Oral)   Wt 206 lb 9.6 oz (93.7 kg)   SpO2 99%   Physical Exam Constitutional:      Appearance: Normal appearance.  HENT:     Right Ear: External ear normal.     Left Ear: External ear normal.     Ears:     Comments: Serous effusion bilaterally.  Eyes:     General: No scleral icterus. Cardiovascular:     Rate and Rhythm: Normal rate and regular rhythm.  Pulmonary:     Effort: Pulmonary effort is normal.     Breath sounds: Normal breath sounds.  Musculoskeletal:     Cervical back: Neck supple.  Skin:    General: Skin is warm and dry.  Neurological:     General: No focal deficit present.     Mental  Status: He is alert.  Psychiatric:        Mood and Affect: Mood normal.        Behavior: Behavior normal.     ------------------------------------------------------------------------------------------------------------------------------------------------------------------------------------------------------------------- Assessment and Plan  Eustachian tube dysfunction Start flonase daily Can add mucinex with increased fluids as well.  Can consider adding antihistamine if not improving with this.   Follow up if symptoms do not improve or having new/worsening symptoms.     Meds ordered this encounter  Medications  . fluticasone (FLONASE) 50 MCG/ACT nasal spray    Sig: Place 2 sprays into both nostrils daily.    Dispense:  16 g    Refill:  6    No follow-ups on file.    This visit occurred during the SARS-CoV-2 public health emergency.  Safety protocols were in place, including screening questions prior to the visit, additional usage of staff PPE, and extensive cleaning of exam room while observing appropriate contact time as indicated for disinfecting solutions.

## 2020-01-06 NOTE — Assessment & Plan Note (Signed)
Start flonase daily Can add mucinex with increased fluids as well.  Can consider adding antihistamine if not improving with this.   Follow up if symptoms do not improve or having new/worsening symptoms.

## 2020-01-06 NOTE — Patient Instructions (Signed)
Eustachian Tube Dysfunction ° °Eustachian tube dysfunction refers to a condition in which a blockage develops in the narrow passage that connects the middle ear to the back of the nose (eustachian tube). The eustachian tube regulates air pressure in the middle ear by letting air move between the ear and nose. It also helps to drain fluid from the middle ear space. °Eustachian tube dysfunction can affect one or both ears. When the eustachian tube does not function properly, air pressure, fluid, or both can build up in the middle ear. °What are the causes? °This condition occurs when the eustachian tube becomes blocked or cannot open normally. Common causes of this condition include: °· Ear infections. °· Colds and other infections that affect the nose, mouth, and throat (upper respiratory tract). °· Allergies. °· Irritation from cigarette smoke. °· Irritation from stomach acid coming up into the esophagus (gastroesophageal reflux). The esophagus is the tube that carries food from the mouth to the stomach. °· Sudden changes in air pressure, such as from descending in an airplane or scuba diving. °· Abnormal growths in the nose or throat, such as: °? Growths that line the nose (nasal polyps). °? Abnormal growth of cells (tumors). °? Enlarged tissue at the back of the throat (adenoids). °What increases the risk? °You are more likely to develop this condition if: °· You smoke. °· You are overweight. °· You are a child who has: °? Certain birth defects of the mouth, such as cleft palate. °? Large tonsils or adenoids. °What are the signs or symptoms? °Common symptoms of this condition include: °· A feeling of fullness in the ear. °· Ear pain. °· Clicking or popping noises in the ear. °· Ringing in the ear. °· Hearing loss. °· Loss of balance. °· Dizziness. °Symptoms may get worse when the air pressure around you changes, such as when you travel to an area of high elevation, fly on an airplane, or go scuba diving. °How is  this diagnosed? °This condition may be diagnosed based on: °· Your symptoms. °· A physical exam of your ears, nose, and throat. °· Tests, such as those that measure: °? The movement of your eardrum (tympanogram). °? Your hearing (audiometry). °How is this treated? °Treatment depends on the cause and severity of your condition. °· In mild cases, you may relieve your symptoms by moving air into your ears. This is called "popping the ears." °· In more severe cases, or if you have symptoms of fluid in your ears, treatment may include: °? Medicines to relieve congestion (decongestants). °? Medicines that treat allergies (antihistamines). °? Nasal sprays or ear drops that contain medicines that reduce swelling (steroids). °? A procedure to drain the fluid in your eardrum (myringotomy). In this procedure, a small tube is placed in the eardrum to: °§ Drain the fluid. °§ Restore the air in the middle ear space. °? A procedure to insert a balloon device through the nose to inflate the opening of the eustachian tube (balloon dilation). °Follow these instructions at home: °Lifestyle °· Do not do any of the following until your health care provider approves: °? Travel to high altitudes. °? Fly in airplanes. °? Work in a pressurized cabin or room. °? Scuba dive. °· Do not use any products that contain nicotine or tobacco, such as cigarettes and e-cigarettes. If you need help quitting, ask your health care provider. °· Keep your ears dry. Wear fitted earplugs during showering and bathing. Dry your ears completely after. °General instructions °· Take over-the-counter   and prescription medicines only as told by your health care provider. °· Use techniques to help pop your ears as recommended by your health care provider. These may include: °? Chewing gum. °? Yawning. °? Frequent, forceful swallowing. °? Closing your mouth, holding your nose closed, and gently blowing as if you are trying to blow air out of your nose. °· Keep all  follow-up visits as told by your health care provider. This is important. °Contact a health care provider if: °· Your symptoms do not go away after treatment. °· Your symptoms come back after treatment. °· You are unable to pop your ears. °· You have: °? A fever. °? Pain in your ear. °? Pain in your head or neck. °? Fluid draining from your ear. °· Your hearing suddenly changes. °· You become very dizzy. °· You lose your balance. °Summary °· Eustachian tube dysfunction refers to a condition in which a blockage develops in the eustachian tube. °· It can be caused by ear infections, allergies, inhaled irritants, or abnormal growths in the nose or throat. °· Symptoms include ear pain, hearing loss, or ringing in the ears. °· Mild cases are treated with maneuvers to unblock the ears, such as yawning or ear popping. °· Severe cases are treated with medicines. Surgery may also be done (rare). °This information is not intended to replace advice given to you by your health care provider. Make sure you discuss any questions you have with your health care provider. °Document Revised: 08/21/2017 Document Reviewed: 08/21/2017 °Elsevier Patient Education © 2020 [employer] ° °

## 2020-01-29 DIAGNOSIS — M5136 Other intervertebral disc degeneration, lumbar region: Secondary | ICD-10-CM | POA: Diagnosis not present

## 2020-02-06 ENCOUNTER — Telehealth (INDEPENDENT_AMBULATORY_CARE_PROVIDER_SITE_OTHER): Payer: BC Managed Care – PPO | Admitting: Family Medicine

## 2020-02-06 ENCOUNTER — Encounter: Payer: Self-pay | Admitting: Family Medicine

## 2020-02-06 ENCOUNTER — Other Ambulatory Visit: Payer: Self-pay

## 2020-02-06 ENCOUNTER — Ambulatory Visit: Payer: BC Managed Care – PPO | Admitting: Family Medicine

## 2020-02-06 VITALS — Temp 101.0°F | Wt 206.0 lb

## 2020-02-06 DIAGNOSIS — R0981 Nasal congestion: Secondary | ICD-10-CM | POA: Diagnosis not present

## 2020-02-06 DIAGNOSIS — R509 Fever, unspecified: Secondary | ICD-10-CM

## 2020-02-06 DIAGNOSIS — R52 Pain, unspecified: Secondary | ICD-10-CM | POA: Diagnosis not present

## 2020-02-06 DIAGNOSIS — Z20822 Contact with and (suspected) exposure to covid-19: Secondary | ICD-10-CM

## 2020-02-06 NOTE — Progress Notes (Signed)
Symptoms started 02/05/20: Fever Body aches Runny nose Congestion Sore throat  Took Tylenol temporarily reduced fever from 101 to 99.   Advised patient to purchase pulse ox to monitor O2 saturation.  Pt has Pfizer vaccine.

## 2020-02-08 DIAGNOSIS — Z20822 Contact with and (suspected) exposure to covid-19: Secondary | ICD-10-CM | POA: Insufficient documentation

## 2020-02-08 LAB — SARS-COV-2, NAA 2 DAY TAT

## 2020-02-08 LAB — NOVEL CORONAVIRUS, NAA: SARS-CoV-2, NAA: DETECTED — AB

## 2020-02-08 NOTE — Assessment & Plan Note (Signed)
Tested for COVID earlier this morning.   He will continue to quarantine with symptomatic and supportive care.  If test returns positive he would like referral to antibody infusion.  Instructed that if he experiences worsening shortness of breath or severe fatigue he should seek emergency care.

## 2020-02-08 NOTE — Progress Notes (Signed)
Brian Russell - 59 y.o. male MRN [id number]  Date of birth: [birth year]   This visit type was conducted due to national recommendations for restrictions regarding the COVID-19 Pandemic (e.g. social distancing).  This format is felt to be most appropriate for this patient at this time.  All issues noted in this document were discussed and addressed.  No physical exam was performed (except for noted visual exam findings with Video Visits).  I discussed the limitations of evaluation and management by telemedicine and the availability of in person appointments. The patient expressed understanding and agreed to proceed.  I connected with@ on 02/08/20 at 10:10 AM EDT by a video enabled telemedicine application and verified that I am speaking with the correct person using two identifiers.  Present at visit: [name], DO Myrtie Soman   Patient Location: [place] [address]   Provider location:   [facility]  Chief Complaint  Patient presents with  . COVID Symptoms    HPI  Brian Russell is a 59 y.o. male who presents via audio/video conferencing for a telehealth visit today.  He has complaint of COVID like symptoms including fever, chills, body aches, runny nose, congestion and sore throat.  He does have mild cough but denies chest pain, shortness of breath or severe fatigue.  He has had vaccine. He is interested in monoclonal antibody if his test returns positive.     ROS:  A comprehensive ROS was completed and negative except as noted per HPI  Past Medical History:  Diagnosis Date  . Hypertension   . Low thyroid stimulating hormone (TSH) level     Past Surgical History:  Procedure Laterality Date  . fractured bone    . JOINT REPLACEMENT    . SPINAL FUSION      Family History  Problem Relation Age of Onset  . Hypertension Other   . Heart attack Other   . Stroke Other     Social History   Socioeconomic History  . Marital status: Married     Spouse name: Not on file  . Number of children: Not on file  . Years of education: Not on file  . Highest education level: Not on file  Occupational History  . Occupation: Product manager  Tobacco Use  . Smoking status: Former Smoker    Types: Cigarettes    Quit date: 05/2011    Years since quitting: 8.7  . Smokeless tobacco: Never Used  Vaping Use  . Vaping Use: Never used  Substance and Sexual Activity  . Alcohol use: Yes    Alcohol/week: 2.0 standard drinks    Types: 2 Standard drinks or equivalent per week  . Drug use: Never  . Sexual activity: Yes    Partners: Female  Other Topics Concern  . Not on file  Social History Narrative  . Not on file   Social Determinants of Health   Financial Resource Strain:   . Difficulty of Paying Living Expenses: Not on file  Food Insecurity:   . Worried About Charity fundraiser in the Last Year: Not on file  . Ran Out of Food in the Last Year: Not on file  Transportation Needs:   . Lack of Transportation (Medical): Not on file  . Lack of Transportation (Non-Medical): Not on file  Physical Activity:   . Days of Exercise per Week: Not on file  . Minutes of Exercise per Session: Not on file  Stress:   . Feeling  of Stress : Not on file  Social Connections:   . Frequency of Communication with Friends and Family: Not on file  . Frequency of Social Gatherings with Friends and Family: Not on file  . Attends Religious Services: Not on file  . Active Member of Clubs or Organizations: Not on file  . Attends Archivist Meetings: Not on file  . Marital Status: Not on file  Intimate Partner Violence:   . Fear of Current or Ex-Partner: Not on file  . Emotionally Abused: Not on file  . Physically Abused: Not on file  . Sexually Abused: Not on file     Current Outpatient Medications:  .  aspirin EC 81 MG tablet, Take 81 mg by mouth daily., Disp: , Rfl:  .  atorvastatin (LIPITOR) 40 MG tablet, Take 40 mg by mouth daily.,  Disp: , Rfl:  .  buPROPion (WELLBUTRIN SR) 150 MG 12 hr tablet, Take 150 mg by mouth daily. , Disp: , Rfl:  .  chlorthalidone (HYGROTON) 25 MG tablet, Take 1 tablet (25 mg total) by mouth daily., Disp: 90 tablet, Rfl: 2 .  fluticasone (FLONASE) 50 MCG/ACT nasal spray, Place 2 sprays into both nostrils daily., Disp: 16 g, Rfl: 6 .  gabapentin (NEURONTIN) 800 MG tablet, Take 1 tablet (800 mg total) by mouth at bedtime., Disp: 30 tablet, Rfl: 3 .  HYDROcodone-acetaminophen (NORCO/VICODIN) 5-325 MG tablet, Take 1 tablet by mouth every 8 (eight) hours as needed for moderate pain., Disp: 30 tablet, Rfl: 0 .  levothyroxine (SYNTHROID) 150 MCG tablet, Take 1 tablet (150 mcg total) by mouth daily before breakfast., Disp: 90 tablet, Rfl: 3 .  losartan (COZAAR) 100 MG tablet, Take 1 tablet (100 mg total) by mouth daily., Disp: 90 tablet, Rfl: 2 .  meloxicam (MOBIC) 15 MG tablet, One tab PO qAM with a meal for 2 weeks, then daily prn pain., Disp: 30 tablet, Rfl: 3 .  omeprazole (PRILOSEC) 40 MG capsule, Take 1 capsule (40 mg total) by mouth daily., Disp: 90 capsule, Rfl: 3 .  tiZANidine (ZANAFLEX) 4 MG tablet, Take 1 tablet (4 mg total) by mouth every 6 (six) hours as needed for muscle spasms., Disp: 30 tablet, Rfl: 0 .  traZODone (DESYREL) 50 MG tablet, Take 50 mg by mouth at bedtime., Disp: , Rfl:   EXAM:  VITALS per patient if applicable: Temp (!) 277 F (38.3 C)   Wt 206 lb (93.4 kg)   GENERAL: alert, oriented, appears well and in no acute distress  HEENT: atraumatic, conjunttiva clear, no obvious abnormalities on inspection of external nose and ears  NECK: normal movements of the head and neck  LUNGS: on inspection no signs of respiratory distress, breathing rate appears normal, no obvious gross SOB, gasping or wheezing  CV: no obvious cyanosis  MS: moves all visible extremities without noticeable abnormality  PSYCH/NEURO: pleasant and cooperative, no obvious depression or anxiety, speech and  thought processing grossly intact  ASSESSMENT AND PLAN:  Discussed the following assessment and plan:  Suspected COVID-19 virus infection Tested for COVID earlier this morning.   He will continue to quarantine with symptomatic and supportive care.  If test returns positive he would like referral to antibody infusion.  Instructed that if he experiences worsening shortness of breath or severe fatigue he should seek emergency care.       I discussed the assessment and treatment plan with the patient. The patient was provided an opportunity to ask questions and all were answered. The  patient agreed with the plan and demonstrated an understanding of the instructions.   The patient was advised to call back or seek an in-person evaluation if the symptoms worsen or if the condition fails to improve as anticipated.    [name], DO

## 2020-02-09 ENCOUNTER — Other Ambulatory Visit: Payer: Self-pay | Admitting: Physician Assistant

## 2020-02-09 DIAGNOSIS — I10 Essential (primary) hypertension: Secondary | ICD-10-CM

## 2020-02-09 DIAGNOSIS — U071 COVID-19: Secondary | ICD-10-CM

## 2020-02-09 DIAGNOSIS — E663 Overweight: Secondary | ICD-10-CM

## 2020-02-09 NOTE — Progress Notes (Signed)
I connected by phone with Brian Russell on 02/09/2020 at 1:21 PM to discuss the potential use of a new treatment for mild to moderate COVID-19 viral infection in non-hospitalized patients.  This patient is a 59 y.o. male that meets the FDA criteria for Emergency Use Authorization of COVID monoclonal antibody casirivimab/imdevimab or bamlanivimab/eteseviamb.  Has a (+) direct SARS-CoV-2 viral test result  Has mild or moderate COVID-19   Is NOT hospitalized due to COVID-19  Is within 10 days of symptom onset  Has at least one of the high risk factor(s) for progression to severe COVID-19 and/or hospitalization as defined in EUA.  Specific high risk criteria : BMI > 25 and Cardiovascular disease or hypertension   I have spoken and communicated the following to the patient or parent/caregiver regarding COVID monoclonal antibody treatment:  1. FDA has authorized the emergency use for the treatment of mild to moderate COVID-19 in adults and pediatric patients with positive results of direct SARS-CoV-2 viral testing who are 76 years of age and older weighing at least 40 kg, and who are at high risk for progressing to severe COVID-19 and/or hospitalization.  2. The significant known and potential risks and benefits of COVID monoclonal antibody, and the extent to which such potential risks and benefits are unknown.  3. Information on available alternative treatments and the risks and benefits of those alternatives, including clinical trials.  4. Patients treated with COVID monoclonal antibody should continue to self-isolate and use infection control measures (e.g., wear mask, isolate, social distance, avoid sharing personal items, clean and disinfect "high touch" surfaces, and frequent handwashing) according to CDC guidelines.   5. The patient or parent/caregiver has the option to accept or refuse COVID monoclonal antibody treatment.  After reviewing this information with the patient, the  patient has agreed to receive one of the available covid 19 monoclonal antibodies and will be provided an appropriate fact sheet prior to infusion.   [place], [place] 02/09/2020 1:21 PM

## 2020-02-10 ENCOUNTER — Ambulatory Visit (HOSPITAL_COMMUNITY): Payer: BC Managed Care – PPO | Attending: Pulmonary Disease

## 2020-02-11 ENCOUNTER — Ambulatory Visit (HOSPITAL_COMMUNITY)
Admission: RE | Admit: 2020-02-11 | Discharge: 2020-02-11 | Disposition: A | Discharge status: Home / Self Care | Payer: BC Managed Care – PPO | Source: Ambulatory Visit | Attending: Pulmonary Disease | Admitting: Pulmonary Disease

## 2020-02-11 DIAGNOSIS — U071 COVID-19: Secondary | ICD-10-CM

## 2020-02-11 MED ORDER — EPINEPHRINE 0.3 MG/0.3ML IJ SOAJ: 0.3000 mg | Freq: Once | INTRAMUSCULAR | Status: DC | PRN

## 2020-02-11 MED ORDER — SODIUM CHLORIDE 0.9 % IV SOLN: INTRAVENOUS | Status: DC | PRN

## 2020-02-11 MED ORDER — ALBUTEROL SULFATE HFA 108 (90 BASE) MCG/ACT IN AERS: 2.0000 | INHALATION_SPRAY | Freq: Once | RESPIRATORY_TRACT | Status: DC | PRN

## 2020-02-11 MED ORDER — METHYLPREDNISOLONE SODIUM SUCC 125 MG IJ SOLR: 125.0000 mg | Freq: Once | INTRAMUSCULAR | Status: DC | PRN

## 2020-02-11 MED ORDER — DIPHENHYDRAMINE HCL 50 MG/ML IJ SOLN: 50.0000 mg | Freq: Once | INTRAMUSCULAR | Status: DC | PRN

## 2020-02-11 MED ORDER — SODIUM CHLORIDE 0.9 % IV SOLN
1200.0000 mg | Freq: Once | INTRAVENOUS | Status: AC
  Administered 2020-02-11: 1200 mg via INTRAVENOUS

## 2020-02-11 MED ORDER — FAMOTIDINE IN NACL 20-0.9 MG/50ML-% IV SOLN: 20.0000 mg | Freq: Once | INTRAVENOUS | Status: DC | PRN

## 2020-02-11 NOTE — Discharge Instructions (Signed)

## 2020-02-11 NOTE — Progress Notes (Signed)
  Diagnosis: COVID-19  Physician:Dr Joya Gaskins   Procedure: [facility] Med: casirivimab\imdevimab infusion - Provided patient with casirivimab\imdevimab fact sheet for patients, parents and caregivers prior to infusion.  Complications: No immediate complications noted.  Discharge: Discharged home   [name] 02/11/2020

## 2020-02-20 ENCOUNTER — Telehealth: Payer: BC Managed Care – PPO | Admitting: Family Medicine

## 2020-02-23 ENCOUNTER — Telehealth (INDEPENDENT_AMBULATORY_CARE_PROVIDER_SITE_OTHER): Payer: BC Managed Care – PPO | Admitting: Family Medicine

## 2020-02-23 ENCOUNTER — Encounter: Payer: Self-pay | Admitting: Family Medicine

## 2020-02-23 VITALS — BP 122/78 | Temp 97.8°F

## 2020-02-23 DIAGNOSIS — H9313 Tinnitus, bilateral: Secondary | ICD-10-CM

## 2020-02-23 DIAGNOSIS — R42 Dizziness and giddiness: Secondary | ICD-10-CM

## 2020-02-23 DIAGNOSIS — H6983 Other specified disorders of Eustachian tube, bilateral: Secondary | ICD-10-CM | POA: Diagnosis not present

## 2020-02-23 MED ORDER — AZELASTINE HCL 0.1 % NA SOLN: 2.0000 | Freq: Two times a day (BID) | NASAL | 3 refills | Status: DC

## 2020-02-23 NOTE — Progress Notes (Signed)
Still feels like he's in a fog. He gets a few days reprieve and it restarts.

## 2020-02-23 NOTE — Progress Notes (Addendum)
Brian Russell - 59 y.o. male MRN [id number]  Date of birth: 1960-09-27   This visit type was conducted due to national recommendations for restrictions regarding the COVID-19 Pandemic (e.g. social distancing).  This format is felt to be most appropriate for this patient at this time.  All issues noted in this document were discussed and addressed.  No physical exam was performed (except for noted visual exam findings with Video Visits).  I discussed the limitations of evaluation and management by telemedicine and the availability of in person appointments. The patient expressed understanding and agreed to proceed.  I connected with@ on 02/23/20 at  3:20 PM EDT by a video enabled telemedicine application and verified that I am speaking with the correct person using two identifiers.  Present at visit: [name], DO Myrtie Soman   Patient Location: [place] [address]   Provider location:   [facility]  No chief complaint on file.   HPI  Brian Russell is a 59 y.o. male who presents via audio/video conferencing for a telehealth visit today.  He has complaint of continued tinnitus, dizziness, and ear fullness.  He feels like he is in a "fog" at times.  He reports that symptoms did worsen when he had COVID.  He has tried flonase and antihistamines without much improvement.  He has not had any nausea.       ROS:  A comprehensive ROS was completed and negative except as noted per HPI  Past Medical History:  Diagnosis Date  . Hypertension   . Low thyroid stimulating hormone (TSH) level     Past Surgical History:  Procedure Laterality Date  . fractured bone    . JOINT REPLACEMENT    . SPINAL FUSION      Family History  Problem Relation Age of Onset  . Hypertension Other   . Heart attack Other   . Stroke Other     Social History   Socioeconomic History  . Marital status: Married    Spouse name: Not on file  . Number of children: Not on file   . Years of education: Not on file  . Highest education level: Not on file  Occupational History  . Occupation: Product manager  Tobacco Use  . Smoking status: Former Smoker    Types: Cigarettes    Quit date: 05/2011    Years since quitting: 8.7  . Smokeless tobacco: Never Used  Vaping Use  . Vaping Use: Never used  Substance and Sexual Activity  . Alcohol use: Yes    Alcohol/week: 2.0 standard drinks    Types: 2 Standard drinks or equivalent per week  . Drug use: Never  . Sexual activity: Yes    Partners: Female  Other Topics Concern  . Not on file  Social History Narrative  . Not on file   Social Determinants of Health   Financial Resource Strain:   . Difficulty of Paying Living Expenses: Not on file  Food Insecurity:   . Worried About Charity fundraiser in the Last Year: Not on file  . Ran Out of Food in the Last Year: Not on file  Transportation Needs:   . Lack of Transportation (Medical): Not on file  . Lack of Transportation (Non-Medical): Not on file  Physical Activity:   . Days of Exercise per Week: Not on file  . Minutes of Exercise per Session: Not on file  Stress:   . Feeling of Stress : Not  on file  Social Connections:   . Frequency of Communication with Friends and Family: Not on file  . Frequency of Social Gatherings with Friends and Family: Not on file  . Attends Religious Services: Not on file  . Active Member of Clubs or Organizations: Not on file  . Attends Archivist Meetings: Not on file  . Marital Status: Not on file  Intimate Partner Violence:   . Fear of Current or Ex-Partner: Not on file  . Emotionally Abused: Not on file  . Physically Abused: Not on file  . Sexually Abused: Not on file     Current Outpatient Medications:  .  aspirin EC 81 MG tablet, Take 81 mg by mouth daily., Disp: , Rfl:  .  atorvastatin (LIPITOR) 40 MG tablet, Take 40 mg by mouth daily., Disp: , Rfl:  .  buPROPion (WELLBUTRIN SR) 150 MG 12 hr tablet,  Take 150 mg by mouth daily. , Disp: , Rfl:  .  chlorthalidone (HYGROTON) 25 MG tablet, Take 1 tablet (25 mg total) by mouth daily., Disp: 90 tablet, Rfl: 2 .  fluticasone (FLONASE) 50 MCG/ACT nasal spray, Place 2 sprays into both nostrils daily., Disp: 16 g, Rfl: 6 .  gabapentin (NEURONTIN) 800 MG tablet, Take 1 tablet (800 mg total) by mouth at bedtime., Disp: 30 tablet, Rfl: 3 .  HYDROcodone-acetaminophen (NORCO/VICODIN) 5-325 MG tablet, Take 1 tablet by mouth every 8 (eight) hours as needed for moderate pain., Disp: 30 tablet, Rfl: 0 .  levothyroxine (SYNTHROID) 150 MCG tablet, Take 1 tablet (150 mcg total) by mouth daily before breakfast., Disp: 90 tablet, Rfl: 3 .  losartan (COZAAR) 100 MG tablet, Take 1 tablet (100 mg total) by mouth daily., Disp: 90 tablet, Rfl: 2 .  meloxicam (MOBIC) 15 MG tablet, One tab PO qAM with a meal for 2 weeks, then daily prn pain., Disp: 30 tablet, Rfl: 3 .  omeprazole (PRILOSEC) 40 MG capsule, Take 1 capsule (40 mg total) by mouth daily., Disp: 90 capsule, Rfl: 3 .  tiZANidine (ZANAFLEX) 4 MG tablet, Take 1 tablet (4 mg total) by mouth every 6 (six) hours as needed for muscle spasms., Disp: 30 tablet, Rfl: 0 .  traZODone (DESYREL) 50 MG tablet, Take 50 mg by mouth at bedtime., Disp: , Rfl:  .  azelastine (ASTELIN) 0.1 % nasal spray, Place 2 sprays into both nostrils 2 (two) times daily. Use in each nostril as directed, Disp: 30 mL, Rfl: 3  EXAM:  VITALS per patient if applicable: BP 329/51   Temp 97.8 F (36.6 C)   GENERAL: alert, oriented, appears well and in no acute distress  HEENT: atraumatic, conjunttiva clear, no obvious abnormalities on inspection of external nose and ears  NECK: normal movements of the head and neck  LUNGS: on inspection no signs of respiratory distress, breathing rate appears normal, no obvious gross SOB, gasping or wheezing  CV: no obvious cyanosis  MS: moves all visible extremities without noticeable  abnormality  PSYCH/NEURO: pleasant and cooperative, no obvious depression or anxiety, speech and thought processing grossly intact  ASSESSMENT AND PLAN:  Discussed the following assessment and plan:  Eustachian tube dysfunction Symptoms consistent with eustachian tube dysfunction. Possibility of meniere's disease as well. Change flonase to astelin.  Referral to ENT.       I discussed the assessment and treatment plan with the patient. The patient was provided an opportunity to ask questions and all were answered. The patient agreed with the plan and demonstrated an  understanding of the instructions.   The patient was advised to call back or seek an in-person evaluation if the symptoms worsen or if the condition fails to improve as anticipated.    [name], DO

## 2020-02-23 NOTE — Assessment & Plan Note (Signed)
Symptoms consistent with eustachian tube dysfunction. Possibility of meniere's disease as well. Change flonase to astelin.  Referral to ENT.

## 2020-03-09 ENCOUNTER — Other Ambulatory Visit: Payer: Self-pay | Admitting: Sports Medicine

## 2020-03-09 DIAGNOSIS — G8929 Other chronic pain: Secondary | ICD-10-CM

## 2020-03-09 DIAGNOSIS — M545 Low back pain, unspecified: Secondary | ICD-10-CM

## 2020-04-15 ENCOUNTER — Ambulatory Visit (INDEPENDENT_AMBULATORY_CARE_PROVIDER_SITE_OTHER): Payer: BC Managed Care – PPO | Admitting: Family Medicine

## 2020-04-15 ENCOUNTER — Encounter: Payer: Self-pay | Admitting: Family Medicine

## 2020-04-15 VITALS — BP 126/88 | HR 60 | Wt 210.1 lb

## 2020-04-15 DIAGNOSIS — H6983 Other specified disorders of Eustachian tube, bilateral: Secondary | ICD-10-CM

## 2020-04-15 DIAGNOSIS — R42 Dizziness and giddiness: Secondary | ICD-10-CM

## 2020-04-15 DIAGNOSIS — M545 Low back pain, unspecified: Secondary | ICD-10-CM | POA: Diagnosis not present

## 2020-04-15 DIAGNOSIS — D489 Neoplasm of uncertain behavior, unspecified: Secondary | ICD-10-CM | POA: Diagnosis not present

## 2020-04-15 DIAGNOSIS — I10 Essential (primary) hypertension: Secondary | ICD-10-CM

## 2020-04-15 DIAGNOSIS — G8929 Other chronic pain: Secondary | ICD-10-CM

## 2020-04-15 DIAGNOSIS — E785 Hyperlipidemia, unspecified: Secondary | ICD-10-CM

## 2020-04-15 DIAGNOSIS — E039 Hypothyroidism, unspecified: Secondary | ICD-10-CM

## 2020-04-15 MED ORDER — BUPROPION HCL ER (XL) 150 MG PO TB24: 150.0000 mg | ORAL_TABLET | Freq: Every day | ORAL | 2 refills | Status: DC

## 2020-04-15 MED ORDER — GABAPENTIN 800 MG PO TABS: 800.0000 mg | ORAL_TABLET | Freq: Every day | ORAL | 2 refills | Status: DC

## 2020-04-15 MED ORDER — ATORVASTATIN CALCIUM 40 MG PO TABS
40.0000 mg | ORAL_TABLET | Freq: Every day | ORAL | 2 refills | Status: DC
Start: 2020-04-15 — End: 2022-01-19

## 2020-04-15 NOTE — Progress Notes (Signed)
Brian Russell - 59 y.o. male MRN [id number]  Date of birth: February 01, 1961  Subjective Chief Complaint  Patient presents with  . Hypertension  . Hyperlipidemia    HPI Brian Russell is a 59 y.o. male here today for follow up visit or HTN and HLD.  He also reports that he continues to experience episodes of dizziness and ear fullness.  He had appt with ENT but had COVID and had to cancel.  He requests another referral.  He has stopped flonase because it didn't seem to be helping.    He also continues to have neck pain.  He is getting relief with meloxicam and gabapentin however he has stopped meloxicam because he felt like he was taking too many medications.    HTN remains well controlled with chlorthalidone and losartan.   He has not had any noticeable side effects with this.  He denies chest pain, shortness of breath, palpitations, headache or vision changes.   He is tolerating atorvastatin well with myalgias.   ROS:  A comprehensive ROS was completed and negative except as noted per HPI  Allergies  Allergen Reactions  . Amlodipine   . Buspar [Buspirone]     Past Medical History:  Diagnosis Date  . Hypertension   . Low thyroid stimulating hormone (TSH) level     Past Surgical History:  Procedure Laterality Date  . fractured bone    . JOINT REPLACEMENT    . SPINAL FUSION      Social History   Socioeconomic History  . Marital status: Married    Spouse name: Not on file  . Number of children: Not on file  . Years of education: Not on file  . Highest education level: Not on file  Occupational History  . Occupation: Product manager  Tobacco Use  . Smoking status: Former Smoker    Types: Cigarettes    Quit date: 05/2011    Years since quitting: 8.9  . Smokeless tobacco: Never Used  Vaping Use  . Vaping Use: Never used  Substance and Sexual Activity  . Alcohol use: Yes    Alcohol/week: 2.0 standard drinks    Types: 2 Standard drinks or equivalent per week  .  Drug use: Never  . Sexual activity: Yes    Partners: Female  Other Topics Concern  . Not on file  Social History Narrative  . Not on file   Social Determinants of Health   Financial Resource Strain:   . Difficulty of Paying Living Expenses: Not on file  Food Insecurity:   . Worried About Charity fundraiser in the Last Year: Not on file  . Ran Out of Food in the Last Year: Not on file  Transportation Needs:   . Lack of Transportation (Medical): Not on file  . Lack of Transportation (Non-Medical): Not on file  Physical Activity:   . Days of Exercise per Week: Not on file  . Minutes of Exercise per Session: Not on file  Stress:   . Feeling of Stress : Not on file  Social Connections:   . Frequency of Communication with Friends and Family: Not on file  . Frequency of Social Gatherings with Friends and Family: Not on file  . Attends Religious Services: Not on file  . Active Member of Clubs or Organizations: Not on file  . Attends Archivist Meetings: Not on file  . Marital Status: Not on file    Family History  Problem Relation Age of Onset  .  Hypertension Other   . Heart attack Other   . Stroke Other     Health Maintenance  Topic Date Due  . Hepatitis C Screening  Never done  . COVID-19 Vaccine (1) Never done  . HIV Screening  Never done  . INFLUENZA VACCINE  12/14/2019  . TETANUS/TDAP  07/24/2021  . COLONOSCOPY  12/15/2027     ----------------------------------------------------------------------------------------------------------------------------------------------------------------------------------------------------------------- Physical Exam BP 126/88 (BP Location: Left Arm, Patient Position: Sitting, Cuff Size: Normal)   Pulse 60   Wt 210 lb 1.6 oz (95.3 kg)   SpO2 98%   Physical Exam Constitutional:      Appearance: Normal appearance.  HENT:     Head: Normocephalic and atraumatic.  Cardiovascular:     Rate and Rhythm: Normal rate and  regular rhythm.  Pulmonary:     Effort: Pulmonary effort is normal.     Breath sounds: Normal breath sounds.  Skin:    General: Skin is warm and dry.  Neurological:     General: No focal deficit present.     Mental Status: He is alert.  Psychiatric:        Mood and Affect: Mood normal.        Behavior: Behavior normal.     ------------------------------------------------------------------------------------------------------------------------------------------------------------------------------------------------------------------- Assessment and Plan  Hypothyroidism Lab Results  Component Value Date   TSH 0.60 10/07/2019  He is doing well on current dose of levothyroxine, denies symptoms at this time.   Essential hypertension Blood pressure is at goal at for age and co-morbidities.  I recommend continuation of current medications.  In addition they were instructed to follow a low sodium diet with regular exercise to help to maintain adequate control of blood pressure.    Eustachian tube dysfunction Symptoms consistent with eustachian tube dysfunction. Possibility of meniere's disease as well..  Referral to ENT.    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  We'll plan to update labs at next follow up.     Meds ordered this encounter  Medications  . gabapentin (NEURONTIN) 800 MG tablet    Sig: Take 1 tablet (800 mg total) by mouth at bedtime.    Dispense:  90 tablet    Refill:  2  . atorvastatin (LIPITOR) 40 MG tablet    Sig: Take 1 tablet (40 mg total) by mouth daily.    Dispense:  90 tablet    Refill:  2  . buPROPion (WELLBUTRIN XL) 150 MG 24 hr tablet    Sig: Take 1 tablet (150 mg total) by mouth daily.    Dispense:  90 tablet    Refill:  2    Return in about 6 months (around 10/14/2020) for HTN/HLD.    This  visit occurred during the SARS-CoV-2 public health emergency.  Safety protocols were in place, including screening questions prior to the visit, additional usage of staff PPE, and extensive cleaning of exam room while observing appropriate contact time as indicated for disinfecting solutions.

## 2020-04-15 NOTE — Assessment & Plan Note (Signed)
Referral to dermatology re-entered

## 2020-04-15 NOTE — Assessment & Plan Note (Signed)
Symptoms consistent with eustachian tube dysfunction. Possibility of meniere's disease as well..  Referral to ENT.

## 2020-04-15 NOTE — Assessment & Plan Note (Signed)
Blood pressure is at goal at for age and co-morbidities.  I recommend continuation of current medications.  In addition they were instructed to follow a low sodium diet with regular exercise to help to maintain adequate control of blood pressure.  ? ?

## 2020-04-15 NOTE — Assessment & Plan Note (Signed)
Lab Results  Component Value Date   LDLCALC 126 (H) 10/07/2019  He is tolerating atorvastatin well.  We'll plan to update labs at next follow up.

## 2020-04-15 NOTE — Assessment & Plan Note (Signed)
Lab Results  Component Value Date   TSH 0.60 10/07/2019  He is doing well on current dose of levothyroxine, denies symptoms at this time.

## 2020-04-15 NOTE — Patient Instructions (Signed)
I have placed referrals again.  Let's plan to follow up again in 6 months-We'll check labs at that time.

## 2020-05-01 ENCOUNTER — Other Ambulatory Visit: Payer: Self-pay

## 2020-05-01 ENCOUNTER — Emergency Department (INDEPENDENT_AMBULATORY_CARE_PROVIDER_SITE_OTHER)
Admission: RE | Admit: 2020-05-01 | Discharge: 2020-05-01 | Disposition: A | Discharge status: Home / Self Care | Payer: BC Managed Care – PPO | Source: Ambulatory Visit

## 2020-05-01 VITALS — BP 135/86 | HR 59 | Temp 98.2°F | Resp 20 | Ht 71.0 in | Wt 205.0 lb

## 2020-05-01 DIAGNOSIS — M5432 Sciatica, left side: Secondary | ICD-10-CM

## 2020-05-01 MED ORDER — PREDNISONE 20 MG PO TABS: ORAL_TABLET | ORAL | 1 refills | Status: DC

## 2020-05-01 MED ORDER — HYDROCODONE-ACETAMINOPHEN 5-325 MG PO TABS: 1.0000 | ORAL_TABLET | Freq: Three times a day (TID) | ORAL | 0 refills | Status: DC | PRN

## 2020-05-01 NOTE — ED Triage Notes (Signed)
Pt presents to Urgent Care with c/o lower back pain which extends to L buttocks. He states he has hx of back problems, but pain increased about one week ago and is uncontrolled w/ Meloxicam and Tylenol. Denies numbness or tingling.

## 2020-05-01 NOTE — ED Provider Notes (Signed)
[facility]    CSN: [id number] Arrival date & time: 05/01/20  1438      History   Chief Complaint Chief Complaint  Patient presents with  . Back Pain    HPI Brian Russell is a 59 y.o. male.   This is the initial urgent care visit at [facility] for this 59 year old man who is complaining of back pain.  Pt presents to Urgent Care with c/o lower back pain which extends to L buttocks. He states he has hx of back problems, but pain increased about one week ago and is uncontrolled w/ Meloxicam and Tylenol. Denies numbness or tingling.  Patient's only been to work today and then went to visit his son.  He says it pressure on the left ischium is getting excruciating.  There is no radiation of the pain, no trouble with bowel movements.  Patient has had a back fusion in the past.     Past Medical History:  Diagnosis Date  . Hypertension   . Low thyroid stimulating hormone (TSH) level     Patient Active Problem List   Diagnosis Date Noted  . Eustachian tube dysfunction 01/06/2020  . Cervical spondylosis 12/10/2019  . Essential hypertension 10/15/2019  . Hypothyroidism 10/15/2019  . HLD (hyperlipidemia) 10/15/2019  . Chronic low back pain 10/15/2019  . Tinea pedis 10/15/2019  . Well adult exam 10/15/2019  . Neoplasm of uncertain behavior 92/33/0076  . Neck pain 09/25/2019    Past Surgical History:  Procedure Laterality Date  . fractured bone    . JOINT REPLACEMENT    . SPINAL FUSION         Home Medications    Prior to Admission medications   Medication Sig Start Date End Date Taking? Authorizing Provider  aspirin EC 81 MG tablet Take 81 mg by mouth daily.    [provider]  atorvastatin (LIPITOR) 40 MG tablet Take 1 tablet (40 mg total) by mouth daily. 04/15/20   [name], DO  buPROPion (WELLBUTRIN XL) 150 MG 24 hr tablet Take 1 tablet (150 mg total) by mouth daily. 04/15/20   [name], DO  chlorthalidone (HYGROTON) 25 MG  tablet Take 1 tablet (25 mg total) by mouth daily. 10/15/19   [name], DO  gabapentin (NEURONTIN) 800 MG tablet Take 1 tablet (800 mg total) by mouth at bedtime. 04/15/20   [name], DO  HYDROcodone-acetaminophen (NORCO/VICODIN) 5-325 MG tablet Take 1 tablet by mouth every 8 (eight) hours as needed for moderate pain. 12/30/19   Silverio Decamp, MD  levothyroxine (SYNTHROID) 150 MCG tablet Take 1 tablet (150 mcg total) by mouth daily before breakfast. 12/31/19   [name], DO  losartan (COZAAR) 100 MG tablet Take 1 tablet (100 mg total) by mouth daily. 10/15/19   [name], DO  meloxicam (MOBIC) 15 MG tablet One tab PO qAM with a meal for 2 weeks, then daily prn pain. 10/29/19   Silverio Decamp, MD  omeprazole (PRILOSEC) 40 MG capsule Take 1 capsule (40 mg total) by mouth daily. 12/31/19   [name], DO    Family History Family History  Problem Relation Age of Onset  . Hypertension Other   . Heart attack Other   . Stroke Other   . Alzheimer's disease Mother   . Stroke Father     Social History Social History   Tobacco Use  . Smoking status: Former Smoker    Types: Cigarettes    Quit date: 05/2011  Years since quitting: 8.9  . Smokeless tobacco: Never Used  Vaping Use  . Vaping Use: Never used  Substance Use Topics  . Alcohol use: Yes    Alcohol/week: 2.0 standard drinks    Types: 2 Standard drinks or equivalent per week  . Drug use: Never     Allergies   Amlodipine and Buspar [buspirone]   Review of Systems Review of Systems  Musculoskeletal: Positive for back pain.  All other systems reviewed and are negative.    Physical Exam Triage Vital Signs ED Triage Vitals  Enc Vitals Group     BP 05/01/20 1449 135/86     Pulse Rate 05/01/20 1449 (!) 59     Resp 05/01/20 1449 20     Temp 05/01/20 1449 98.2 F (36.8 C)     Temp Source 05/01/20 1449 Oral     SpO2 05/01/20 1449 98 %     Weight 05/01/20 1446 205 lb (93 kg)     Height  05/01/20 1446 5\' 11"  (1.803 m)     Head Circumference --      Peak Flow --      Pain Score 05/01/20 1445 10     Pain Loc --      Pain Edu? --      Excl. in [place]? --    No data found.  Updated Vital Signs BP 135/86 (BP Location: Right Arm)   Pulse (!) 59   Temp 98.2 F (36.8 C) (Oral)   Resp 20   Ht 5\' 11"  (1.803 m)   Wt 93 kg   SpO2 98%   BMI 28.59 kg/m    Physical Exam Vitals and nursing note reviewed.  Constitutional:      Appearance: Normal appearance. He is normal weight.  HENT:     Head: Normocephalic.  Eyes:     Conjunctiva/sclera: Conjunctivae normal.  Cardiovascular:     Rate and Rhythm: Normal rate.  Pulmonary:     Effort: Pulmonary effort is normal.  Musculoskeletal:        General: No swelling, tenderness, deformity or signs of injury. Normal range of motion.     Cervical back: Normal range of motion and neck supple.     Comments: Patient is lying awkwardly in the chair resting on his right hip.  He is able to straighten his right leg and his left leg normally.  He has no numbness in the left leg  Skin:    General: Skin is warm and dry.  Neurological:     General: No focal deficit present.     Mental Status: He is alert.  Psychiatric:        Mood and Affect: Mood normal.        Thought Content: Thought content normal.        Judgment: Judgment normal.      UC Treatments / Results  Labs (all labs ordered are listed, but only abnormal results are displayed) Labs Reviewed - No data to display  EKG   Radiology No results found.  Procedures Procedures (including critical care time)  Medications Ordered in UC Medications - No data to display  Initial Impression / Assessment and Plan / UC Course  I have reviewed the triage vital signs and the nursing notes.  Pertinent labs & imaging results that were available during my care of the patient were reviewed by me and considered in my medical decision making (see chart for details).    Final  Clinical Impressions(s) /  UC Diagnoses   Final diagnoses:  None   Discharge Instructions   None    ED Prescriptions    None     PDMP not reviewed this encounter.   Robyn Haber, MD 05/01/20 1505

## 2020-05-06 ENCOUNTER — Other Ambulatory Visit: Payer: Self-pay | Admitting: Family Medicine

## 2020-05-06 DIAGNOSIS — M545 Low back pain, unspecified: Secondary | ICD-10-CM

## 2020-05-06 DIAGNOSIS — G8929 Other chronic pain: Secondary | ICD-10-CM

## 2020-05-10 DIAGNOSIS — L570 Actinic keratosis: Secondary | ICD-10-CM | POA: Diagnosis not present

## 2020-05-10 DIAGNOSIS — L820 Inflamed seborrheic keratosis: Secondary | ICD-10-CM | POA: Diagnosis not present

## 2020-05-24 ENCOUNTER — Encounter: Payer: Self-pay | Admitting: Family Medicine

## 2020-05-30 ENCOUNTER — Other Ambulatory Visit: Payer: Self-pay | Admitting: Family Medicine

## 2020-05-30 DIAGNOSIS — M545 Low back pain, unspecified: Secondary | ICD-10-CM

## 2020-05-30 DIAGNOSIS — G8929 Other chronic pain: Secondary | ICD-10-CM

## 2020-06-17 ENCOUNTER — Ambulatory Visit (INDEPENDENT_AMBULATORY_CARE_PROVIDER_SITE_OTHER): Payer: BC Managed Care – PPO | Admitting: Family Medicine

## 2020-06-17 ENCOUNTER — Other Ambulatory Visit: Payer: Self-pay

## 2020-06-17 ENCOUNTER — Encounter: Payer: Self-pay | Admitting: Family Medicine

## 2020-06-17 VITALS — BP 130/89 | HR 57 | Wt 208.3 lb

## 2020-06-17 DIAGNOSIS — M25511 Pain in right shoulder: Secondary | ICD-10-CM | POA: Diagnosis not present

## 2020-06-17 DIAGNOSIS — M5440 Lumbago with sciatica, unspecified side: Secondary | ICD-10-CM | POA: Insufficient documentation

## 2020-06-17 DIAGNOSIS — M5442 Lumbago with sciatica, left side: Secondary | ICD-10-CM | POA: Diagnosis not present

## 2020-06-17 DIAGNOSIS — M7581 Other shoulder lesions, right shoulder: Secondary | ICD-10-CM | POA: Diagnosis not present

## 2020-06-17 MED ORDER — METHYLPREDNISOLONE 4 MG PO TBPK: ORAL_TABLET | ORAL | 0 refills | Status: DC

## 2020-06-17 MED ORDER — METHYLPREDNISOLONE ACETATE 40 MG/ML IJ SUSP: 40.0000 mg | Freq: Once | INTRAMUSCULAR | Status: DC

## 2020-06-17 NOTE — Assessment & Plan Note (Signed)
Has handout for home exercises.  Rx for medrol dosepak.  He will let me know if not improving with this.

## 2020-06-17 NOTE — Assessment & Plan Note (Signed)
He has a history of cervical spondylosis however pain does not seem radicular at this time.  He seems to have rotator cuff tendonitis but also seems to have glenohumeral pain.  Subacromial injection given today with some relief of pain.  May need future intra-articular injection and discussed that I would recommend that he see Dr. Darene Lamer if not improving.

## 2020-06-17 NOTE — Progress Notes (Signed)
Brian Russell - 60 y.o. male MRN [id number]  Date of birth: 05/10/1961  Subjective Chief Complaint  Patient presents with  . Back Pain  . Shoulder Pain    HPI Brian Russell is a 60 y.o. male here today with complaint of low back and R shoulder pain.  He has history of chronic low back pain.  Seen 1.5 months ago at urgent care given norco rx.  Pain radiates to buttock and upper leg. No numbness, tingling or weakness.  Medrol dosepak has worked well for him in the past.   R shoulder with pain.  Worse with overhead movement or reaching behind him.  Denies radiation to the arm, numbness, tingling or weakness.   ROS:  A comprehensive ROS was completed and negative except as noted per HPI  Allergies  Allergen Reactions  . Amlodipine   . Buspar [Buspirone]     Past Medical History:  Diagnosis Date  . Hypertension   . Low thyroid stimulating hormone (TSH) level     Past Surgical History:  Procedure Laterality Date  . fractured bone    . JOINT REPLACEMENT    . SPINAL FUSION      Social History   Socioeconomic History  . Marital status: Married    Spouse name: Not on file  . Number of children: Not on file  . Years of education: Not on file  . Highest education level: Not on file  Occupational History  . Occupation: Product manager  Tobacco Use  . Smoking status: Former Smoker    Types: Cigarettes    Quit date: 05/2011    Years since quitting: 9.0  . Smokeless tobacco: Never Used  Vaping Use  . Vaping Use: Never used  Substance and Sexual Activity  . Alcohol use: Yes    Alcohol/week: 2.0 standard drinks    Types: 2 Standard drinks or equivalent per week  . Drug use: Never  . Sexual activity: Yes    Partners: Female  Other Topics Concern  . Not on file  Social History Narrative  . Not on file   Social Determinants of Health   Financial Resource Strain: Not on file  Food Insecurity: Not on file  Transportation Needs: Not on file  Physical Activity:  Not on file  Stress: Not on file  Social Connections: Not on file    Family History  Problem Relation Age of Onset  . Hypertension Other   . Heart attack Other   . Stroke Other   . Alzheimer's disease Mother   . Stroke Father     Health Maintenance  Topic Date Due  . Hepatitis C Screening  Never done  . HIV Screening  Never done  . INFLUENZA VACCINE  12/14/2019  . COVID-19 Vaccine (3 - Booster for Pfizer series) 02/06/2020  . TETANUS/TDAP  07/24/2021  . COLONOSCOPY (Pts 45-68yrs Insurance coverage will need to be confirmed)  12/15/2027     ----------------------------------------------------------------------------------------------------------------------------------------------------------------------------------------------------------------- Physical Exam BP 130/89 (BP Location: Left Arm, Patient Position: Sitting, Cuff Size: Large)   Pulse (!) 57   Wt 208 lb 4.8 oz (94.5 kg)   SpO2 97%   BMI 29.05 kg/m   Physical Exam Constitutional:      Appearance: Normal appearance.  Cardiovascular:     Rate and Rhythm: Normal rate and regular rhythm.  Musculoskeletal:     Comments: R shoulder ROM is limted with abduction and overhead movement.  Pain hawkins test.  Mild pain with empty can.   Skin:  General: Skin is warm.  Neurological:     General: No focal deficit present.     Mental Status: He is alert.  Psychiatric:        Mood and Affect: Mood normal.        Behavior: Behavior normal.    Procedure:  Procedure reviewed with patient including potential risks.  He elects to proceed with subacromial injection.  Area prepped in typical sterile fashion with chlorhexidine.  Using a posterior approach the subacromial space was injected using 82ml depo-medrol 40mg /ml and 70mL of 1% lidocaine.  Tolerated well, bandaid applied.    ------------------------------------------------------------------------------------------------------------------------------------------------------------------------------------------------------------------- Assessment and Plan  Right shoulder pain He has a history of cervical spondylosis however pain does not seem radicular at this time.  He seems to have rotator cuff tendonitis but also seems to have glenohumeral pain.  Subacromial injection given today with some relief of pain.  May need future intra-articular injection and discussed that I would recommend that he see Dr. Darene Lamer if not improving.    Low back pain with sciatica Has handout for home exercises.  Rx for medrol dosepak.  He will let me know if not improving with this.     Meds ordered this encounter  Medications  . methylPREDNISolone acetate (DEPO-MEDROL) injection 40 mg    Administered by Dr. Zigmund Daniel  . methylPREDNISolone (MEDROL DOSEPAK) 4 MG TBPK tablet    Sig: Taper as directed on packaging.    Dispense:  21 tablet    Refill:  0    No follow-ups on file.    This visit occurred during the SARS-CoV-2 public health emergency.  Safety protocols were in place, including screening questions prior to the visit, additional usage of staff PPE, and extensive cleaning of exam room while observing appropriate contact time as indicated for disinfecting solutions.

## 2020-06-28 ENCOUNTER — Other Ambulatory Visit: Payer: Self-pay | Admitting: Family Medicine

## 2020-06-28 DIAGNOSIS — M545 Low back pain, unspecified: Secondary | ICD-10-CM

## 2020-06-28 DIAGNOSIS — G8929 Other chronic pain: Secondary | ICD-10-CM

## 2020-07-04 ENCOUNTER — Other Ambulatory Visit: Payer: Self-pay | Admitting: Family Medicine

## 2020-08-01 ENCOUNTER — Other Ambulatory Visit: Payer: Self-pay | Admitting: Family Medicine

## 2020-08-01 DIAGNOSIS — G8929 Other chronic pain: Secondary | ICD-10-CM

## 2020-08-01 DIAGNOSIS — M545 Low back pain, unspecified: Secondary | ICD-10-CM

## 2020-08-31 ENCOUNTER — Other Ambulatory Visit: Payer: Self-pay

## 2020-08-31 ENCOUNTER — Encounter: Payer: Self-pay | Admitting: Family Medicine

## 2020-08-31 ENCOUNTER — Ambulatory Visit: Payer: BC Managed Care – PPO | Admitting: Family Medicine

## 2020-08-31 VITALS — BP 139/93 | HR 59 | Wt 208.7 lb

## 2020-08-31 DIAGNOSIS — M48061 Spinal stenosis, lumbar region without neurogenic claudication: Secondary | ICD-10-CM | POA: Diagnosis not present

## 2020-08-31 MED ORDER — HYDROCODONE-ACETAMINOPHEN 5-325 MG PO TABS: 1.0000 | ORAL_TABLET | Freq: Four times a day (QID) | ORAL | 0 refills | Status: DC | PRN

## 2020-08-31 MED ORDER — METHYLPREDNISOLONE 4 MG PO TBPK
ORAL_TABLET | ORAL | 0 refills | Status: DC
Start: 2020-08-31 — End: 2020-11-01

## 2020-08-31 NOTE — Assessment & Plan Note (Signed)
Recurrent episodes of acute on chronic back pain.  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  He has not had much improvement with ESI and PT.  Will renew medrol dosepak and rx for norco.  Referral placed to neurosurgery for consult per his request.

## 2020-08-31 NOTE — Patient Instructions (Signed)
Leslie Dales and Winn neurological surgery (7th ed., pp. [phone]). [place], [place]: Elsevier.">  Spinal Stenosis  Spinal stenosis happens when the spinal canal gets smaller. The spinal canal is the space between the bones of your spine (vertebrae). As the canal gets smaller, the nerves that pass that part of the spine are pressed. This causes pain. Spinal stenosis can affect your neck, upper back, or lower back. What are the causes? This condition is caused by parts of bone that push into your spinal canal. This problem may start before birth. If it occurs after birth, the cause may be:  Breakdown of bones of your spine. This normally starts around 61 years of age.  Injury to your spine.  Tumors in your spine.  A buildup of calcium in your spine. What increases the risk? You are more likely to develop this condition if:  You are older than age 25.  You were born with a problem in your spine, such as a curved spine (scoliosis).  You have arthritis. This is disease of your joints. What are the signs or symptoms? Common symptoms of this condition include:  Pain in your neck or back. The pain may be worse when you stand or walk.  Problems with your legs. A leg may lose feeling, tingle, or turn hot or cold.  Pain that goes from your butt down to your lower leg (sciatica).  Falling a lot.  Slapping your foot down when you walk. This weakens muscles. Severe symptoms of this condition include:  Problems pooping or peeing.  Trouble having sex.  Loss of feeling in your leg.  Being unable to walk. Sometimes there are no symptoms. How is this treated? To treat pain and manage symptoms, you may be asked to:  Practice sitting and standing up straight. This is good posture.  Exercise.  Lose weight, if needed.  Take medicines or get shots.  Support your back with a corset or a brace. In some cases, you may need to have surgery. Follow these instructions at home: Managing pain,  stiffness, and swelling  Stand and sit up straight. If you have a brace or a corset, wear it as told.  Keep a healthy weight. Talk with your doctor if you need help losing weight.  If told, put heat on the affected area. Do this as often as told by your doctor. Use the heat source that your doctor recommends, such as a moist heat pack or a heating pad. ? Place a towel between your skin and the heat source. ? Leave the heat on for 20-30 minutes. ? Take off the heat if your skin turns bright red. This is very important if you are unable to feel pain, heat, or cold. You have a greater risk of getting burned.   Activity  Do exercises as told by your doctor.  Do not do activities that cause pain. Ask your doctor what activities are safe for you.  Do not lift anything that is heavier than 10 lb (4.5 kg), or the limit that you are told by your doctor.  Return to your normal activities as told by your doctor. Ask your doctor what activities are safe for you. General instructions  Take over-the-counter and prescription medicines only as told by your doctor.  Do not use any products that contain nicotine or tobacco, such as cigarettes, e-cigarettes, and chewing tobacco. If you need help quitting, ask your doctor.  Eat a healthy diet. Eat a lot of: ? Fruits. ? Vegetables. ?  Whole grains. ? Low-fat (lean) protein.  Keep all follow-up visits as told by your doctor. This is important. Contact a doctor if:  Your symptoms do not get better.  Your symptoms get worse.  You have a fever. Get help right away if:  You have new pain or very bad pain in your neck or upper back.  You have very bad pain, and medicine does not help.  You have a very bad headache.  You are dizzy.  You do not see well.  You vomit or feel like you may vomit.  You have these things in your back or legs: ? New or worse loss of feeling. ? New or worse tingling.  Your arm or leg: ? Hurts or swells. ? Turns  red. ? Feels warm. Summary  Spinal stenosis happens when the space between the bones of the spine gets smaller. The small space puts pressure on the nerves in your spine.  This condition may start before birth. Breakdown of bones, injuries, or tumors can cause this condition after birth.  Spinal stenosis can cause pain in the neck, back, legs, or butt. A leg may lose feeling, tingle, or turn hot or cold.  Treatment for this condition focuses on lessening your pain and other symptoms. This information is not intended to replace advice given to you by your health care provider. Make sure you discuss any questions you have with your health care provider. Document Revised: 02/27/2019 Document Reviewed: 02/27/2019 Elsevier Patient Education  2021 [employer].

## 2020-08-31 NOTE — Progress Notes (Signed)
Brian Russell - 60 y.o. male MRN [id number]  Date of birth: 1960-09-03  Subjective Chief Complaint  Patient presents with  . Back Pain    HPI Brian Russell is a 60 y.o. male here today with complaint of flare of back pain.  He has history of L sided back pain with sciatica.  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  He has tried epidurals before but this only lasted a few weeks.  Physical therapy was not very helpful  He continues to have acute episodes of back pain that radiates into the L leg.  He does get improvement with steroid and opoid analgesics.  He would like referral to neurosurgery at this point as well due to chronic back pain and failure of conservative measures.    ROS:  A comprehensive ROS was completed and negative except as noted per HPI  Allergies  Allergen Reactions  . Amlodipine   . Buspar [Buspirone]     Past Medical History:  Diagnosis Date  . Hypertension   . Low thyroid stimulating hormone (TSH) level     Past Surgical History:  Procedure Laterality Date  . fractured bone    . JOINT REPLACEMENT    . SPINAL FUSION      Social History   Socioeconomic History  . Marital status: Married    Spouse name: Not on file  . Number of children: Not on file  . Years of education: Not on file  . Highest education level: Not on file  Occupational History  . Occupation: Product manager  Tobacco Use  . Smoking status: Former Smoker    Types: Cigarettes    Quit date: 05/2011    Years since quitting: 9.3  . Smokeless tobacco: Never Used  Vaping Use  . Vaping Use: Never used  Substance and Sexual Activity  . Alcohol use: Yes    Alcohol/week: 2.0 standard drinks    Types: 2 Standard drinks or equivalent per week  . Drug use: Never  . Sexual activity: Yes    Partners: Female  Other Topics Concern  . Not on file  Social History Narrative  . Not on file   Social Determinants of Health   Financial Resource Strain: Not on file  Food  Insecurity: Not on file  Transportation Needs: Not on file  Physical Activity: Not on file  Stress: Not on file  Social Connections: Not on file    Family History  Problem Relation Age of Onset  . Hypertension Other   . Heart attack Other   . Stroke Other   . Alzheimer's disease Mother   . Stroke Father     Health Maintenance  Topic Date Due  . Hepatitis C Screening  Never done  . HIV Screening  Never done  . COVID-19 Vaccine (3 - Booster for Pfizer series) 02/06/2020  . INFLUENZA VACCINE  12/13/2020  . TETANUS/TDAP  07/24/2021  . COLONOSCOPY (Pts 45-44yrs Insurance coverage will need to be confirmed)  12/15/2027  . HPV VACCINES  Aged Out     ----------------------------------------------------------------------------------------------------------------------------------------------------------------------------------------------------------------- Physical Exam BP (!) 139/93 (BP Location: Left Arm, Patient Position: Sitting, Cuff Size: Normal)   Pulse (!) 59   Wt 208 lb 11.2 oz (94.7 kg)   SpO2 96%   BMI 29.11 kg/m   Physical Exam Constitutional:      Appearance: Normal appearance.  HENT:     Head: Normocephalic and atraumatic.  Cardiovascular:     Rate and Rhythm: Normal rate and  regular rhythm.  Musculoskeletal:     Cervical back: Neck supple.     Comments: TTP along L lower paraspinals.  +SLR on the L side.  Antalgic gait.    Neurological:     General: No focal deficit present.     Mental Status: He is alert.  Psychiatric:        Mood and Affect: Mood normal.        Behavior: Behavior normal.     ------------------------------------------------------------------------------------------------------------------------------------------------------------------------------------------------------------------- Assessment and Plan  Lumbar foraminal stenosis Recurrent episodes of acute on chronic back pain.  He as canal and forminal stenosis related to adjacent  segment disease.  He has not had much improvement with ESI and PT.  Will renew medrol dosepak and rx for norco.  Referral placed to neurosurgery for consult per his request.     Meds ordered this encounter  Medications  . methylPREDNISolone (MEDROL DOSEPAK) 4 MG TBPK tablet    Sig: Taper as directed on packaging.    Dispense:  21 tablet    Refill:  0  . HYDROcodone-acetaminophen (NORCO/VICODIN) 5-325 MG tablet    Sig: Take 1 tablet by mouth every 6 (six) hours as needed for moderate pain or severe pain.    Dispense:  30 tablet    Refill:  0    No follow-ups on file.    This visit occurred during the SARS-CoV-2 public health emergency.  Safety protocols were in place, including screening questions prior to the visit, additional usage of staff PPE, and extensive cleaning of exam room while observing appropriate contact time as indicated for disinfecting solutions.

## 2020-09-14 ENCOUNTER — Other Ambulatory Visit: Payer: Self-pay | Admitting: Family Medicine

## 2020-09-14 DIAGNOSIS — M545 Low back pain, unspecified: Secondary | ICD-10-CM

## 2020-09-14 DIAGNOSIS — G8929 Other chronic pain: Secondary | ICD-10-CM

## 2020-09-21 DIAGNOSIS — M5136 Other intervertebral disc degeneration, lumbar region: Secondary | ICD-10-CM | POA: Diagnosis not present

## 2020-09-21 DIAGNOSIS — M4316 Spondylolisthesis, lumbar region: Secondary | ICD-10-CM | POA: Diagnosis not present

## 2020-09-30 ENCOUNTER — Other Ambulatory Visit: Payer: Self-pay | Admitting: Neurosurgery

## 2020-09-30 DIAGNOSIS — M5136 Other intervertebral disc degeneration, lumbar region: Secondary | ICD-10-CM

## 2020-09-30 DIAGNOSIS — M4316 Spondylolisthesis, lumbar region: Secondary | ICD-10-CM

## 2020-09-30 DIAGNOSIS — M542 Cervicalgia: Secondary | ICD-10-CM

## 2020-10-14 ENCOUNTER — Ambulatory Visit: Payer: BC Managed Care – PPO | Admitting: Family Medicine

## 2020-10-30 ENCOUNTER — Ambulatory Visit
Admission: RE | Admit: 2020-10-30 | Discharge: 2020-10-30 | Disposition: A | Discharge status: Home / Self Care | Payer: BC Managed Care – PPO | Source: Ambulatory Visit | Attending: Neurosurgery | Admitting: Neurosurgery

## 2020-10-30 ENCOUNTER — Other Ambulatory Visit: Payer: Self-pay

## 2020-10-30 DIAGNOSIS — M48061 Spinal stenosis, lumbar region without neurogenic claudication: Secondary | ICD-10-CM | POA: Diagnosis not present

## 2020-10-30 DIAGNOSIS — M5136 Other intervertebral disc degeneration, lumbar region: Secondary | ICD-10-CM

## 2020-10-30 DIAGNOSIS — M542 Cervicalgia: Secondary | ICD-10-CM

## 2020-10-30 DIAGNOSIS — M4316 Spondylolisthesis, lumbar region: Secondary | ICD-10-CM

## 2020-10-30 DIAGNOSIS — M5126 Other intervertebral disc displacement, lumbar region: Secondary | ICD-10-CM | POA: Diagnosis not present

## 2020-10-30 IMAGING — MR MR LUMBAR SPINE WO/W CM
4 of 7 series · 25 of 48 positions shown · IV contrast (19 ML Multihance)
Comparison: [DATE]

CLINICAL DATA: Chronic low back pain extending into the hips and
buttocks bilaterally.

EXAM:
MRI LUMBAR SPINE WITHOUT AND WITH CONTRAST
TECHNIQUE: Multiplanar and multiecho pulse sequences of the lumbar spine were
obtained without and with intravenous contrast.
CONTRAST:  19mL MULTIHANCE GADOBENATE DIMEGLUMINE 529 MG/ML IV SOLN

[Series 3: T1 · sagittal · 4.0mm · 0.55mm/px · 5 of 15 slices shown (1 of 2)]
[im 1/15]
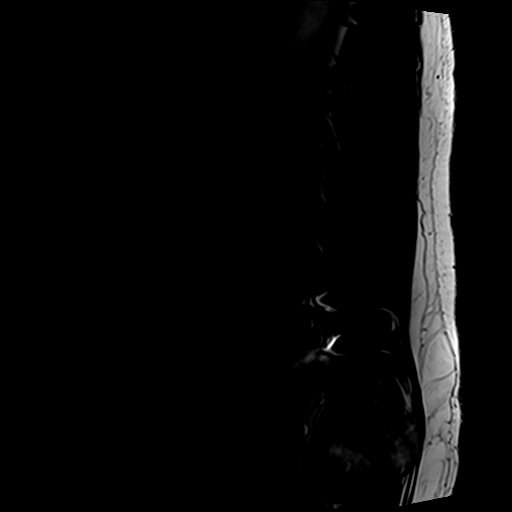
[im 4/15]
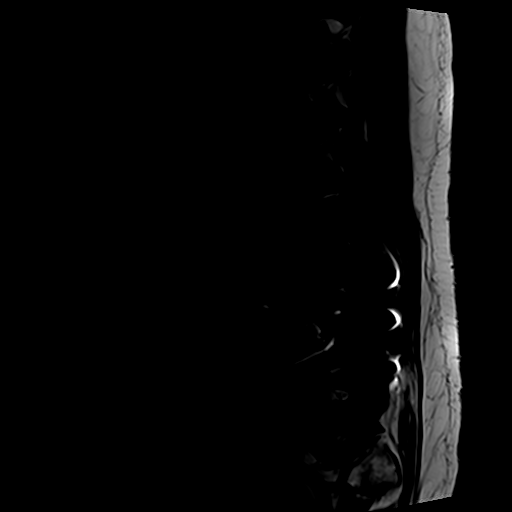
[im 8/15]
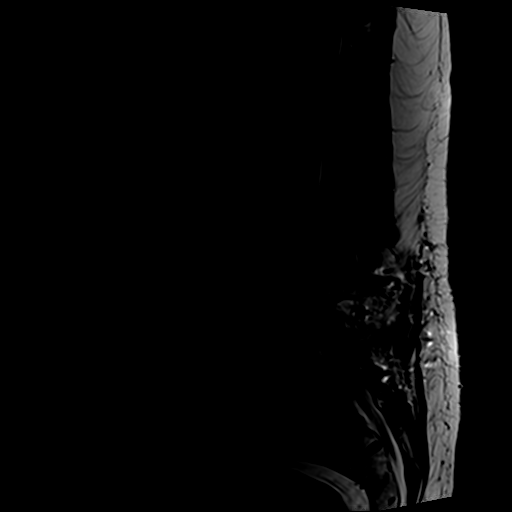
[im 11/15]
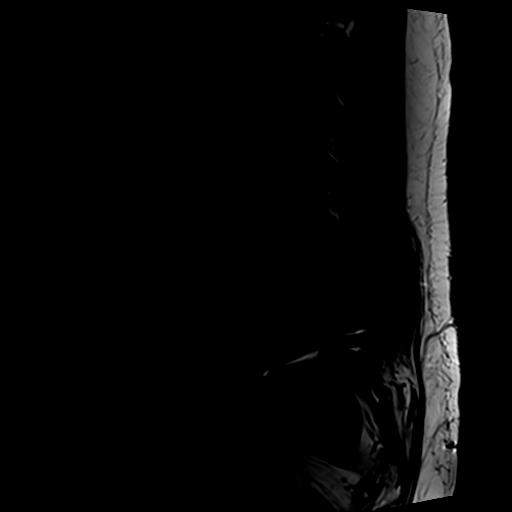
[im 15/15]
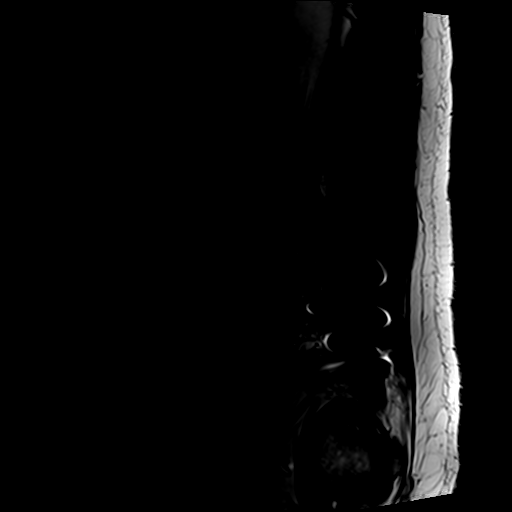

[Series 5: T2 · axial · 4.0mm · 0.70mm/px · z∈[-119,+102]mm · 8 of 38 slices shown (1 of 2)]
[im 1/38]
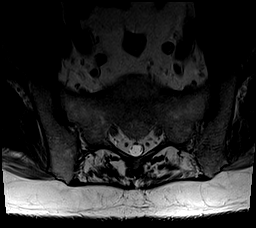
[im 5/38]
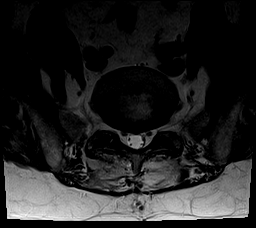
[im 13/38]
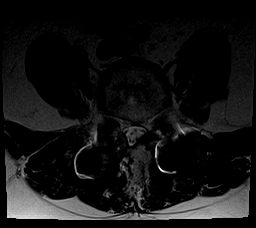
[im 17/38]
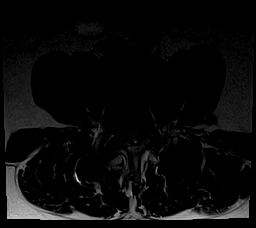
[im 21/38]
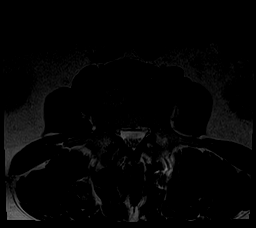
[im 25/38]
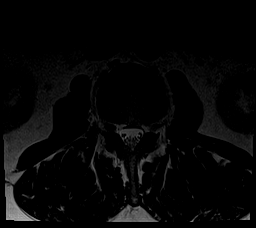
[im 33/38]
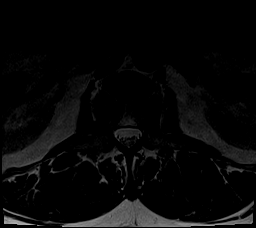
[im 38/38]
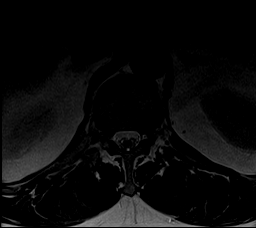

[Series 6: T1 · axial · 4.0mm · 0.35mm/px · z∈[-119,+102]mm · 8 of 38 slices shown (2 of 2)]
[im 1/38]
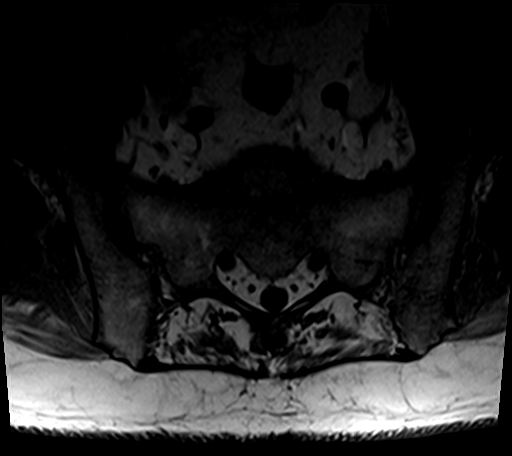
[im 5/38]
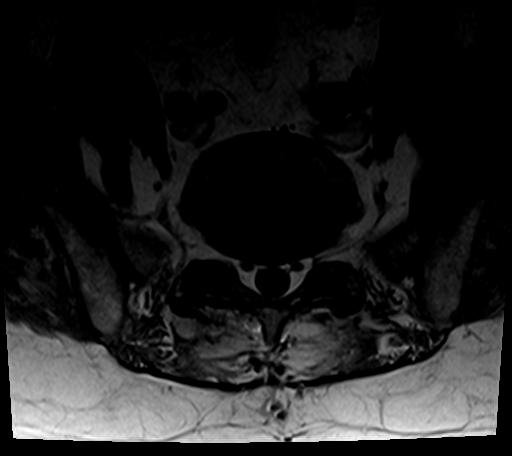
[im 13/38]
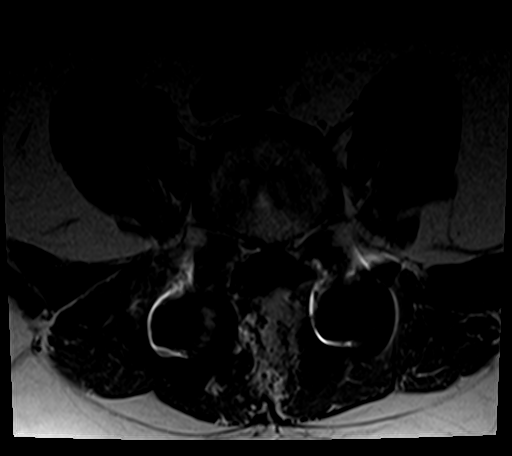
[im 17/38]
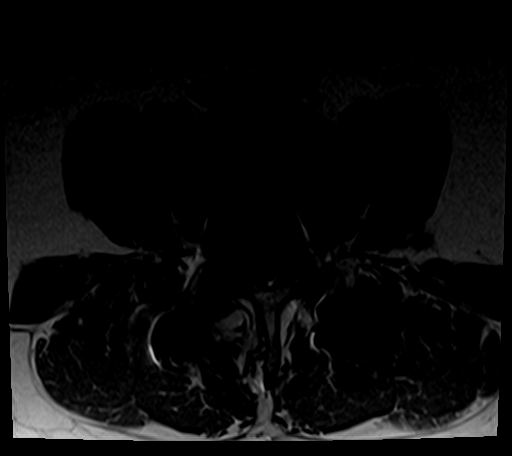
[im 21/38]
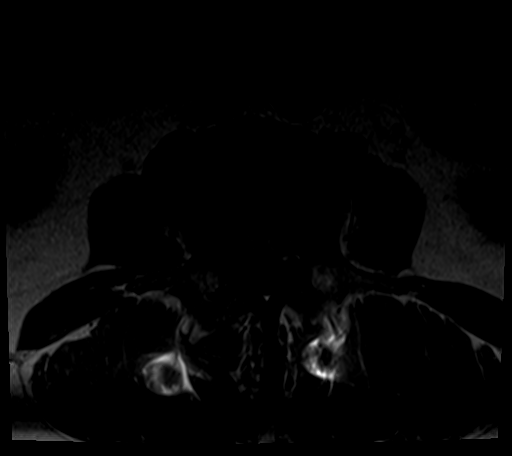
[im 25/38]
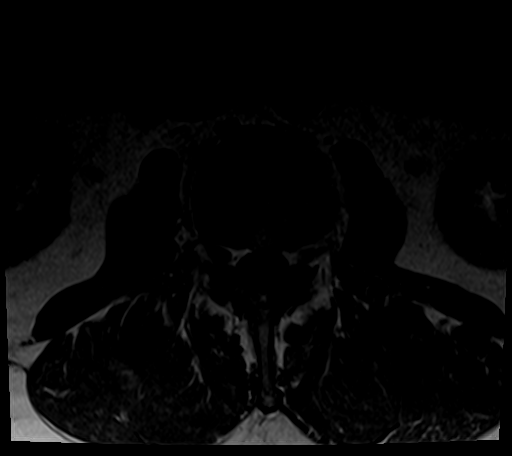
[im 33/38]
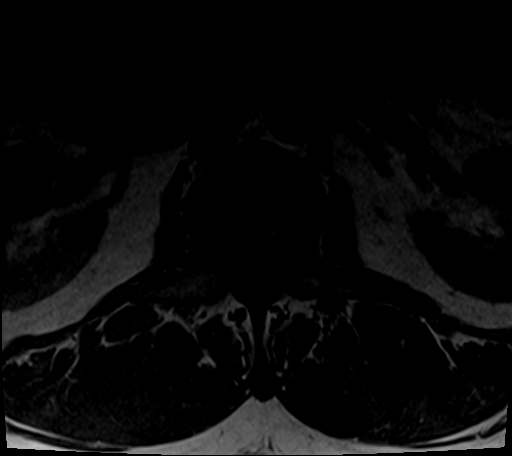
[im 38/38]
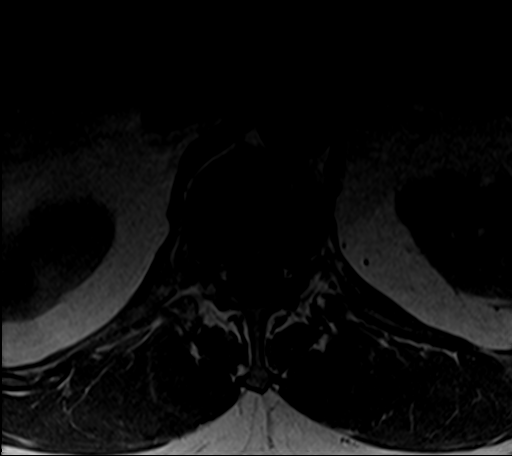

[Series 7: T2 · sagittal · 4.0mm · 0.55mm/px · 4 of 15 slices shown (2 of 2)]
[im 1/15]
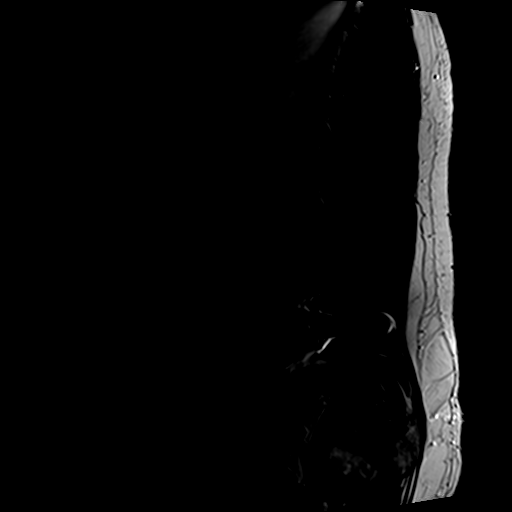
[im 5/15]
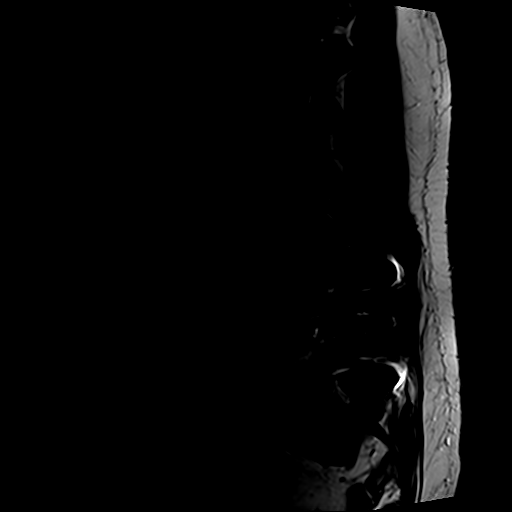
[im 10/15]
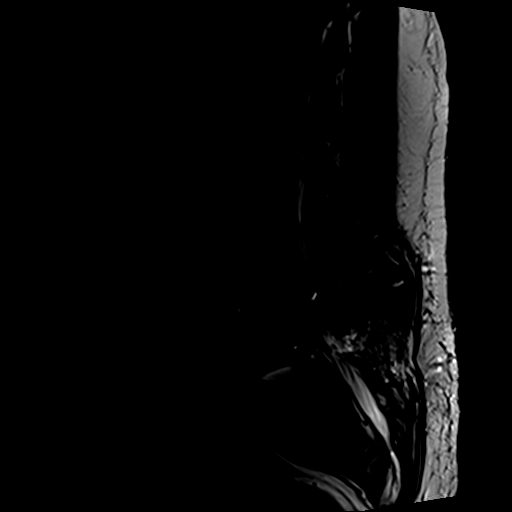
[im 15/15]
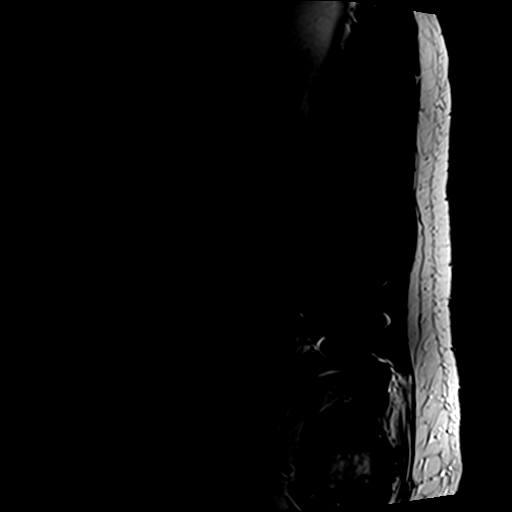

[25 of 48 positions shown; findings below may reference images not displayed]

FINDINGS: Segmentation: 5 non rib-bearing lumbar type vertebral bodies are
present. The lowest fully formed vertebral body is L5.

Alignment:  No significant listhesis is present.

Vertebrae: Chronic marrow changes are associated with the fusion.
Fatty marrow infiltration is present posteriorly at L3-4 is well.
Marrow signal and vertebral body heights are normal.

Conus medullaris and cauda equina: Conus extends to the T12-L1
level. Conus and cauda equina appear normal.

Paraspinal and other soft tissues: Limited imaging the abdomen is
unremarkable. There is no significant adenopathy. No solid organ
lesions are present.

Disc levels:

T12-L1: Negative.

L1-2: Far left lateral disc protrusion results in mild left
foraminal narrowing. Pedicles are short.

L2-3: Broad-based disc protrusion results an mild subarticular and
moderate foraminal stenosis bilaterally without significant interval
change.

L3-4: Progressive severe central canal stenosis is secondary to
broad-based disc protrusion and advanced facet hypertrophy. Moderate
to severe foraminal narrowing bilaterally has progressed.

L4-5: Laminectomy and fusion again noted. Central canal and foramina
are patent.

L5-S1: Moderate facet hypertrophy is present bilaterally. No
significant stenosis.
IMPRESSION: 1. Laminectomy and fusion at L4-5 without residual or recurrent
stenosis.
2. Progressive adjacent level disease with severe central canal
stenosis and moderate to severe foraminal narrowing bilaterally at
L3-4.
3. Stable mild subarticular and moderate foraminal stenosis
bilaterally at L2-3.
4. Moderate facet hypertrophy bilaterally at L5-S1 without
significant stenosis.

## 2020-10-30 MED ORDER — GADOBENATE DIMEGLUMINE 529 MG/ML IV SOLN
19.0000 mL | Freq: Once | INTRAVENOUS | Status: AC | PRN
  Administered 2020-10-30: 19 mL via INTRAVENOUS

## 2020-11-01 ENCOUNTER — Ambulatory Visit (INDEPENDENT_AMBULATORY_CARE_PROVIDER_SITE_OTHER): Payer: BC Managed Care – PPO | Admitting: Family Medicine

## 2020-11-01 ENCOUNTER — Other Ambulatory Visit: Payer: Self-pay

## 2020-11-01 ENCOUNTER — Encounter: Payer: Self-pay | Admitting: Family Medicine

## 2020-11-01 VITALS — BP 118/79 | HR 59 | Ht 71.0 in | Wt 208.0 lb

## 2020-11-01 DIAGNOSIS — E785 Hyperlipidemia, unspecified: Secondary | ICD-10-CM | POA: Diagnosis not present

## 2020-11-01 DIAGNOSIS — M25511 Pain in right shoulder: Secondary | ICD-10-CM

## 2020-11-01 DIAGNOSIS — I10 Essential (primary) hypertension: Secondary | ICD-10-CM | POA: Diagnosis not present

## 2020-11-01 DIAGNOSIS — M545 Low back pain, unspecified: Secondary | ICD-10-CM

## 2020-11-01 DIAGNOSIS — Z125 Encounter for screening for malignant neoplasm of prostate: Secondary | ICD-10-CM

## 2020-11-01 DIAGNOSIS — E039 Hypothyroidism, unspecified: Secondary | ICD-10-CM

## 2020-11-01 DIAGNOSIS — M5442 Lumbago with sciatica, left side: Secondary | ICD-10-CM

## 2020-11-01 DIAGNOSIS — G8929 Other chronic pain: Secondary | ICD-10-CM

## 2020-11-01 MED ORDER — MELOXICAM 15 MG PO TABS: ORAL_TABLET | ORAL | 3 refills | Status: DC

## 2020-11-01 NOTE — Assessment & Plan Note (Addendum)
  Management per neurosurgery.

## 2020-11-01 NOTE — Assessment & Plan Note (Signed)
Update lipid panel. Tolerating atorvastatin at current strength.

## 2020-11-01 NOTE — Progress Notes (Signed)
Brian Russell - 60 y.o. male MRN [id number]  Date of birth: 1961/02/05  Subjective Chief Complaint  Patient presents with   Hyperlipidemia   Hypertension    HPI Brian Russell is 60 y.o. male here today for follow up visit.  He has history of HTN, HLD, Hypothyroidism and chronic low back and neck pain. He has recently seen neurosurgeon, Dr. Christella Noa.  He had an update MRI over the weekend for continued radicular lumbar pain.  This showed worsening adjacent disease at L3-L4 level.  He was unable to tolerate cervical MRI.  He requests refill of meloxicam.  HTN is managed with losartan and chlorthalidone.  He is tolerating these well.  He denies side effects related to medications.  He denies symptoms related to HTN including chest pain, shortness of breath,palpitations, headache or vision changes.    Tolerating atorvastatin well at current strength.   ROS:  A comprehensive ROS was completed and negative except as noted per HPI    Allergies  Allergen Reactions   Amlodipine    Buspar [Buspirone]     Past Medical History:  Diagnosis Date   Hypertension    Low thyroid stimulating hormone (TSH) level     Past Surgical History:  Procedure Laterality Date   fractured bone     JOINT REPLACEMENT     SPINAL FUSION      Social History   Socioeconomic History   Marital status: Married    Spouse name: Not on file   Number of children: Not on file   Years of education: Not on file   Highest education level: Not on file  Occupational History   Occupation: Product manager  Tobacco Use   Smoking status: Former    Pack years: 0.00    Types: Cigarettes    Quit date: 05/2011    Years since quitting: 9.4   Smokeless tobacco: Never  Vaping Use   Vaping Use: Never used  Substance and Sexual Activity   Alcohol use: Yes    Alcohol/week: 2.0 standard drinks    Types: 2 Standard drinks or equivalent per week   Drug use: Never   Sexual activity: Yes    Partners: Female  Other  Topics Concern   Not on file  Social History Narrative   Not on file   Social Determinants of Health   Financial Resource Strain: Not on file  Food Insecurity: Not on file  Transportation Needs: Not on file  Physical Activity: Not on file  Stress: Not on file  Social Connections: Not on file    Family History  Problem Relation Age of Onset   Hypertension Other    Heart attack Other    Stroke Other    Alzheimer's disease Mother    Stroke Father     Health Maintenance  Topic Date Due   HIV Screening  Never done   Hepatitis C Screening  Never done   Zoster Vaccines- Shingrix (1 of 2) Never done   COVID-19 Vaccine (3 - Booster for Pfizer series) 01/06/2020   INFLUENZA VACCINE  12/13/2020   TETANUS/TDAP  07/24/2021   COLONOSCOPY (Pts 45-73yrs Insurance coverage will need to be confirmed)  12/15/2027   Pneumococcal Vaccine 22-40 Years old  Aged Out   HPV VACCINES  Aged Out     ----------------------------------------------------------------------------------------------------------------------------------------------------------------------------------------------------------------- Physical Exam BP 118/79 (BP Location: Left Arm, Patient Position: Sitting, Cuff Size: Large)   Pulse (!) 59   Ht 5\' 11"  (1.803 m)   Wt 208 lb (  94.3 kg)   SpO2 96%   BMI 29.01 kg/m   Physical Exam Constitutional:      Appearance: Normal appearance.  HENT:     Head: Normocephalic and atraumatic.  Eyes:     General: No scleral icterus. Cardiovascular:     Rate and Rhythm: Normal rate and regular rhythm.  Pulmonary:     Effort: Pulmonary effort is normal.     Breath sounds: Normal breath sounds.  Musculoskeletal:     Cervical back: Neck supple.  Neurological:     General: No focal deficit present.     Mental Status: He is alert.  Psychiatric:        Mood and Affect: Mood normal.        Behavior: Behavior normal.     ------------------------------------------------------------------------------------------------------------------------------------------------------------------------------------------------------------------- Assessment and Plan  Essential hypertension Blood pressure is at goal at for age and co-morbidities.  I recommend continuation of current medication.  In addition they were instructed to follow a low sodium diet with regular exercise to help to maintain adequate control of blood pressure.    Hypothyroidism Update TSH.   Low back pain with sciatica  Management per neurosurgery.     Right shoulder pain Meloxicam renewed.    HLD (hyperlipidemia) Update lipid panel. Tolerating atorvastatin at current strength.    Meds ordered this encounter  Medications   meloxicam (MOBIC) 15 MG tablet    Sig: TAKE 1/2 TABLET BY MOUTH DAILY TO  TWO TIMES A DAY    Dispense:  15 tablet    Refill:  3    Return in about 6 months (around 05/03/2021) for HTN/HLD.    This visit occurred during the SARS-CoV-2 public health emergency.  Safety protocols were in place, including screening questions prior to the visit, additional usage of staff PPE, and extensive cleaning of exam room while observing appropriate contact time as indicated for disinfecting solutions.

## 2020-11-01 NOTE — Assessment & Plan Note (Signed)
Blood pressure is at goal at for age and co-morbidities.  I recommend continuation of current medication.  In addition they were instructed to follow a low sodium diet with regular exercise to help to maintain adequate control of blood pressure.

## 2020-11-01 NOTE — Assessment & Plan Note (Signed)
Update TSH

## 2020-11-01 NOTE — Assessment & Plan Note (Signed)
Meloxicam renewed.

## 2020-11-02 ENCOUNTER — Other Ambulatory Visit: Payer: Self-pay | Admitting: Family Medicine

## 2020-11-02 DIAGNOSIS — M5136 Other intervertebral disc degeneration, lumbar region: Secondary | ICD-10-CM | POA: Diagnosis not present

## 2020-11-02 LAB — LIPID PANEL W/REFLEX DIRECT LDL
Cholesterol: 189 mg/dL (ref <200)
HDL: 47 mg/dL (ref >=40)
LDL Cholesterol (Calc): 119 mg/dL (calc) — ABNORMAL HIGH
Non-HDL Cholesterol (Calc): 142 mg/dL (calc) — ABNORMAL HIGH (ref <130)
Total CHOL/HDL Ratio: 4 (calc) (ref <5.0)
Triglycerides: 124 mg/dL (ref <150)

## 2020-11-02 LAB — CBC WITH DIFFERENTIAL/PLATELET
Absolute Monocytes: 589 cells/uL (ref 200–950)
Basophils Absolute: 71 cells/uL (ref 0–200)
Basophils Relative: 1 %
Eosinophils Absolute: 241 cells/uL (ref 15–500)
Eosinophils Relative: 3.4 %
HCT: 43.7 % (ref 38.5–50.0)
Hemoglobin: 15 g/dL (ref 13.2–17.1)
Lymphs Abs: 1960 cells/uL (ref 850–3900)
MCH: 31.3 pg (ref 27.0–33.0)
MCHC: 34.3 g/dL (ref 32.0–36.0)
MCV: 91.2 fL (ref 80.0–100.0)
MPV: 10.4 fL (ref 7.5–12.5)
Monocytes Relative: 8.3 %
Neutro Abs: 4239 cells/uL (ref 1500–7800)
Neutrophils Relative %: 59.7 %
Platelets: 179 10*3/uL (ref 140–400)
RBC: 4.79 10*6/uL (ref 4.20–5.80)
RDW: 12.7 % (ref 11.0–15.0)
Total Lymphocyte: 27.6 %
WBC: 7.1 10*3/uL (ref 3.8–10.8)

## 2020-11-02 LAB — COMPLETE METABOLIC PANEL WITH GFR
AG Ratio: 2.3 (calc) (ref 1.0–2.5)
ALT: 15 U/L (ref 9–46)
AST: 18 U/L (ref 10–35)
Albumin: 4.5 g/dL (ref 3.6–5.1)
Alkaline phosphatase (APISO): 50 U/L (ref 35–144)
BUN: 13 mg/dL (ref 7–25)
CO2: 30 mmol/L (ref 20–32)
Calcium: 9.4 mg/dL (ref 8.6–10.3)
Chloride: 100 mmol/L (ref 98–110)
Creat: 0.93 mg/dL (ref 0.70–1.25)
GFR, Est African American: 102 mL/min/{1.73_m2} (ref >=60)
GFR, Est Non African American: 88 mL/min/{1.73_m2} (ref >=60)
Globulin: 2 g/dL (calc) (ref 1.9–3.7)
Glucose, Bld: 86 mg/dL (ref 65–99)
Potassium: 3.6 mmol/L (ref 3.5–5.3)
Sodium: 143 mmol/L (ref 135–146)
Total Bilirubin: 0.4 mg/dL (ref 0.2–1.2)
Total Protein: 6.5 g/dL (ref 6.1–8.1)

## 2020-11-02 LAB — TSH: TSH: 0.31 mIU/L — ABNORMAL LOW (ref 0.40–4.50)

## 2020-11-02 LAB — PSA: PSA: 0.34 ng/mL (ref <=4.00)

## 2020-11-04 ENCOUNTER — Other Ambulatory Visit: Payer: Self-pay | Admitting: Family Medicine

## 2020-11-04 DIAGNOSIS — E039 Hypothyroidism, unspecified: Secondary | ICD-10-CM

## 2020-11-04 MED ORDER — LEVOTHYROXINE SODIUM 137 MCG PO TABS
137.0000 ug | ORAL_TABLET | Freq: Every day | ORAL | 2 refills | Status: DC
Start: 2020-11-04 — End: 2021-05-05

## 2020-11-10 ENCOUNTER — Ambulatory Visit: Payer: BC Managed Care – PPO | Admitting: Physical Therapy

## 2020-11-10 ENCOUNTER — Other Ambulatory Visit: Payer: Self-pay

## 2020-11-10 ENCOUNTER — Encounter: Payer: Self-pay | Admitting: Physical Therapy

## 2020-11-10 DIAGNOSIS — M545 Low back pain, unspecified: Secondary | ICD-10-CM

## 2020-11-10 DIAGNOSIS — G8929 Other chronic pain: Secondary | ICD-10-CM | POA: Diagnosis not present

## 2020-11-10 DIAGNOSIS — R29898 Other symptoms and signs involving the musculoskeletal system: Secondary | ICD-10-CM

## 2020-11-10 DIAGNOSIS — M6281 Muscle weakness (generalized): Secondary | ICD-10-CM | POA: Diagnosis not present

## 2020-11-10 NOTE — Therapy (Signed)
[facility] [place], [place], [address] Phone: [phone]   Fax:  [phone]  Physical Therapy Evaluation  Patient Details  Name: Brian Russell MRN: [id number] Date of Birth: April 27, 1961 Referring Provider (PT): Ashok Pall   Encounter Date: 11/10/2020   PT End of Session - 11/10/20 1643     Visit Number 1    Number of Visits 6    Date for PT Re-Evaluation 12/22/20    PT Start Time 1600    PT Stop Time 1640    PT Time Calculation (min) 40 min    Activity Tolerance Patient tolerated treatment well    Behavior During Therapy [facility] for tasks assessed/performed             Past Medical History:  Diagnosis Date   Hypertension    Low thyroid stimulating hormone (TSH) level     Past Surgical History:  Procedure Laterality Date   fractured bone     JOINT REPLACEMENT     SPINAL FUSION      There were no vitals filed for this visit.    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 Back pain increased and he had a laminectomy and fusion 06/2012. Pt states he was still in pain after that surgery and his posture was getting worse over the years. Pt had an injection 1 year ago which also did not help. Pt had imaging and saw Dr. Christella Noa who recommended imaging and physical therapy. Pain increases with standing still and sitting in a reclined position. Pain reduces with laying flat, occasional pain meds    Pertinent History lumbar laminectomy and fusion L4-5    Limitations Standing    How long can you stand comfortably? < 1 minute    Diagnostic tests MRI shows stenosis L3-L4    Patient Stated Goals decrease pain and improve activity tolerance    Currently in Pain? No/denies                [facility] PT Assessment - 11/10/20 0001       Assessment   Medical Diagnosis intervertebral  disc degeneration lumbar    Referring Provider (PT) Ashok Pall      Balance Screen   Has the patient fallen in the past 6 months No      Prior Function   Level of Independence Independent      Observation/Other Assessments   Focus on Therapeutic Outcomes (FOTO)  72      ROM / Strength   AROM / PROM / Strength AROM;Strength      AROM   AROM Assessment Site Lumbar    Lumbar Flexion limited 50%    Lumbar Extension limited 75%    Lumbar - Right Side Bend [facility]    Lumbar - Left Side Bend [facility]    Lumbar - Right Rotation [facility]    Lumbar - Left Rotation [facility]      Strength   Strength Assessment Site Hip    Right/Left Hip Right;Left    Right Hip Flexion 4/5    Right Hip Extension 4/5    Right Hip ABduction 5/5    Left Hip Flexion 4/5    Left Hip Extension 4/5    Left Hip ABduction 5/5      Flexibility   Soft Tissue Assessment /Muscle Length yes  Hamstrings decreased bilat      Palpation   Spinal mobility hypomobile CPAs L1-L3    Palpation comment increased mm spasticity lumbar paraspinals bilat      Special Tests   Other special tests SLR and FABER negative bilat                        Objective measurements completed on examination: See above findings.       [facility] Adult PT Treatment/Exercise - 11/10/20 0001       Exercises   Exercises Lumbar      Lumbar Exercises: Stretches   Passive Hamstring Stretch 30 seconds;Right;Left    Passive Hamstring Stretch Limitations seated      Lumbar Exercises: Supine   Pelvic Tilt 5 reps;5 seconds    Heel Slides 10 reps    Heel Slides Limitations with ab set    Bent Knee Raise 10 reps    Bent Knee Raise Limitations with ab set      Lumbar Exercises: Sidelying   Clam 10 reps;Both                    PT Education - 11/10/20 1639     Education Details PT POC and goals, HEP    Person(s) Educated Patient    Methods Explanation;Demonstration;Handout    Comprehension Returned demonstration;Verbalized  understanding                 PT Long Term Goals - 11/10/20 1647       PT LONG TERM GOAL #1   Title Pt will be independent in HEP    Time 6    Period Weeks    Status New    Target Date 12/22/20      PT LONG TERM GOAL #2   Title Pt will improve LE strength to 5/5 to tolerate standing with decreased pain    Time 6    Period Weeks    Status New    Target Date 12/22/20      PT LONG TERM GOAL #3   Title Pt will improve FOTO to >= 74 to demo improved functional mobility    Time 6    Period Weeks    Status New    Target Date 12/22/20      PT LONG TERM GOAL #4   Title Pt will tolerate standing still x 10 minutes with pain <= 3/10    Time 6    Period Weeks    Status New    Target Date 12/22/20                    Plan - 11/10/20 1643     Clinical Impression Statement Pt is a 60 y/o male referred for intervertebral disc degeneration. Pt presents with decreased ROM, decreased strength, increased pain and decreased activity tolerance. Pt will benefit from skilled PT to address deficits and improve functional mobility    Personal Factors and Comorbidities Comorbidity 1;Age    Examination-Activity Limitations Stand;Sit;Locomotion Level;Lift    Examination-Participation Restrictions Community Activity;Yard Work    Stability/Clinical Decision Making Stable/Uncomplicated    Clinical Decision Making Low    Rehab Potential Good    PT Frequency 1x / week    PT Duration 6 weeks    PT Treatment/Interventions Dry needling;Taping;Manual techniques;Patient/family education;Therapeutic exercise;Aquatic Therapy;Cryotherapy;Ultrasound;Moist Heat;Iontophoresis 4mg /ml Dexamethasone;Electrical Stimulation;Therapeutic activities    PT Next Visit Plan Assess HEP, core strength and flexibility    PT  Home Exercise Plan [email or web address]    Consulted and Agree with Plan of Care Patient             Patient will benefit from skilled therapeutic intervention in order to improve the  following deficits and impairments:  Pain, Impaired flexibility, Decreased strength, Decreased activity tolerance, Decreased range of motion, Hypomobility  Visit Diagnosis: Chronic bilateral low back pain without sciatica - Plan: PT plan of care cert/re-cert  Other symptoms and signs involving the musculoskeletal system - Plan: PT plan of care cert/re-cert  Muscle weakness (generalized) - Plan: PT plan of care cert/re-cert     Problem List Patient Active Problem List   Diagnosis Date Noted   Lumbar foraminal stenosis 08/31/2020   Right shoulder pain 06/17/2020   Low back pain with sciatica 06/17/2020   Eustachian tube dysfunction 01/06/2020   Cervical spondylosis 12/10/2019   Essential hypertension 10/15/2019   Hypothyroidism 10/15/2019   HLD (hyperlipidemia) 10/15/2019   Chronic low back pain 10/15/2019   Tinea pedis 10/15/2019   Well adult exam 10/15/2019   Neoplasm of uncertain behavior 37/02/6268   Neck pain 09/25/2019   Nesta Kimple, PT  Xara Paulding 11/10/2020, 4:54 PM  [facility] [place], [place], [address] Phone: [phone]   Fax:  [phone]  Name: Rocko Fesperman MRN: [id number] Date of Birth: [birth year]

## 2020-11-10 NOTE — Patient Instructions (Signed)
Access Code: [email or web address] URL: [email or web address] Date: 11/10/2020 Prepared by: Isabelle Course  Exercises Supine Posterior Pelvic Tilt - 1 x daily - 7 x weekly - 3 sets - 10 reps Supine Transversus Abdominis Bracing with Heel Slide - 1 x daily - 7 x weekly - 3 sets - 10 reps Supine March - 1 x daily - 7 x weekly - 3 sets - 10 reps Clamshell - 1 x daily - 7 x weekly - 3 sets - 10 reps Seated Hamstring Stretch - 1 x daily - 7 x weekly - 3 sets - 1 reps - 20-30 seconds hold

## 2020-11-17 ENCOUNTER — Other Ambulatory Visit: Payer: Self-pay

## 2020-11-17 ENCOUNTER — Ambulatory Visit (INDEPENDENT_AMBULATORY_CARE_PROVIDER_SITE_OTHER): Payer: BC Managed Care – PPO | Admitting: Physical Therapy

## 2020-11-17 ENCOUNTER — Encounter: Payer: Self-pay | Admitting: Physical Therapy

## 2020-11-17 DIAGNOSIS — M6281 Muscle weakness (generalized): Secondary | ICD-10-CM

## 2020-11-17 DIAGNOSIS — R29898 Other symptoms and signs involving the musculoskeletal system: Secondary | ICD-10-CM | POA: Diagnosis not present

## 2020-11-17 DIAGNOSIS — M545 Low back pain, unspecified: Secondary | ICD-10-CM | POA: Diagnosis not present

## 2020-11-17 DIAGNOSIS — R293 Abnormal posture: Secondary | ICD-10-CM | POA: Diagnosis not present

## 2020-11-17 DIAGNOSIS — G8929 Other chronic pain: Secondary | ICD-10-CM

## 2020-11-17 NOTE — Patient Instructions (Addendum)
TENS UNIT: This is helpful for muscle pain and spasm.   Search and Purchase a TENS 7000 2nd edition at [email or web address] It should be less than $30.     TENS unit instructions: Do not shower or bathe with the unit on Turn the unit off before removing electrodes or batteries If the electrodes lose stickiness add a drop of water to the electrodes after they are disconnected from the unit and place on plastic sheet. If you continued to have difficulty, call the TENS unit company to purchase more electrodes. Do not apply lotion on the skin area prior to use. Make sure the skin is clean and dry as this will help prolong the life of the electrodes. After use, always check skin for unusual red areas, rash or other skin difficulties. If there are any skin problems, does not apply electrodes to the same area. Never remove the electrodes from the unit by pulling the wires. Do not use the TENS unit or electrodes other than as directed. Do not change electrode placement without consultating your therapist or physician. Keep 2 fingers with between each electrode. Wear time ratio is 2:1, on to off times.    For example on for 30 minutes off for 15 minutes and then on for 30 minutes off for 15 minutes  Access Code: [email or web address] URL: [email or web address] Date: 11/17/2020 Prepared by: [place]  Exercises Supine Posterior Pelvic Tilt - 1 x daily - 7 x weekly - 3 sets - 10 reps Supine Transversus Abdominis Bracing with Heel Slide - 1 x daily - 7 x weekly - 3 sets - 10 reps Supine March - 1 x daily - 7 x weekly - 3 sets - 10 reps Clamshell - 1 x daily - 7 x weekly - 3 sets - 10 reps Seated Hamstring Stretch - 1 x daily - 7 x weekly - 3 sets - 1 reps - 20-30 seconds hold Sidelying Thoracic Rotation with Open Book - 1 x daily - 7 x weekly - 1 sets - 5 reps - 5-10 seconds hold

## 2020-11-17 NOTE — Therapy (Signed)
[place], [place], [address] Phone: [phone]   Fax:  [phone]  Physical Therapy Treatment  Patient Details  Name: Brian Russell MRN: [id number] Date of Birth: 12/12/1960 Referring Provider (PT): Ashok Pall   Encounter Date: 11/17/2020   PT End of Session - 11/17/20 1639     Visit Number 2    Number of Visits 6    Date for PT Re-Evaluation 12/22/20    PT Start Time 1604    PT Stop Time 2993    PT Time Calculation (min) 43 min    Activity Tolerance Patient tolerated treatment well    Behavior During Therapy [facility] for tasks assessed/performed             Past Medical History:  Diagnosis Date   Hypertension    Low thyroid stimulating hormone (TSH) level     Past Surgical History:  Procedure Laterality Date   fractured bone     JOINT REPLACEMENT     SPINAL FUSION      There were no vitals filed for this visit.   Subjective Assessment - 11/17/20 1607     Subjective "It hurts".  Pt reports he went golfing over weekend; pain remains unchanged. He has been completing his HEP daily.    Pertinent History lumbar laminectomy and fusion L4-5    Limitations Standing    How long can you stand comfortably? < 1 minute    Diagnostic tests MRI shows stenosis L3-L4    Patient Stated Goals decrease pain and improve activity tolerance    Currently in Pain? Yes    Pain Score 6     Pain Location Back    Pain Orientation Right;Left;Lower    Pain Descriptors / Indicators Sore    Aggravating Factors  standing still    Pain Relieving Factors sitting and resting.                [facility] PT Assessment - 11/17/20 0001       Assessment   Medical Diagnosis intervertebral disc degeneration lumbar    Referring Provider (PT) Ashok Pall               [facility] Adult PT Treatment/Exercise - 11/17/20 0001       Lumbar Exercises: Stretches   Passive Hamstring Stretch Right;Left;2 reps;30  seconds   seated   Piriformis Stretch Left;Right;2 reps;20 seconds   seated, pulling knee towards opp shoulder.   Other Lumbar Stretch Exercise sidelying thoracic rotation (open book) x 8 reps each side.      Lumbar Exercises: Aerobic   Tread Mill 3.82mh x 5 min for warm up.      Lumbar Exercises: Supine   Heel Slides 10 reps    Bent Knee Raise 10 reps    Bent Knee Raise Limitations with ab set      Lumbar Exercises: Sidelying   Clam Both;10 reps    Clam Limitations reverse clam x 10      Modalities   Modalities Electrical Stimulation;Moist Heat      Moist Heat Therapy   Number Minutes Moist Heat 10 Minutes    Moist Heat Location Lumbar Spine      Electrical Stimulation   Electrical Stimulation Location lumbar area bilat    Electrical Stimulation Action TENS    Electrical Stimulation Parameters 10 min, intensity to tolerance    Electrical Stimulation Goals Pain  PT Education - 11/17/20 1637     Education Details HEP    Person(s) Educated Patient    Methods Explanation;Demonstration;Verbal cues;Handout    Comprehension Verbalized understanding                 PT Long Term Goals - 11/10/20 1647       PT LONG TERM GOAL #1   Title Pt will be independent in HEP    Time 6    Period Weeks    Status New    Target Date 12/22/20      PT LONG TERM GOAL #2   Title Pt will improve LE strength to 5/5 to tolerate standing with decreased pain    Time 6    Period Weeks    Status New    Target Date 12/22/20      PT LONG TERM GOAL #3   Title Pt will improve FOTO to >= 74 to demo improved functional mobility    Time 6    Period Weeks    Status New    Target Date 12/22/20      PT LONG TERM GOAL #4   Title Pt will tolerate standing still x 10 minutes with pain <= 3/10    Time 6    Period Weeks    Status New    Target Date 12/22/20                   Plan - 11/17/20 1605     Clinical Impression Statement Pt tolerated  exercises well.  Added thoracic rotation in sidelying to HEP.  No goals met yet.    Personal Factors and Comorbidities Comorbidity 1;Age    Examination-Activity Limitations Stand;Sit;Locomotion Level;Lift    Examination-Participation Restrictions Community Activity;Yard Work    Stability/Clinical Decision Making Stable/Uncomplicated    Rehab Potential Good    PT Frequency 1x / week    PT Duration 6 weeks    PT Treatment/Interventions Dry needling;Taping;Manual techniques;Patient/family education;Therapeutic exercise;Aquatic Therapy;Cryotherapy;Ultrasound;Moist Heat;Iontophoresis 72m/ml Dexamethasone;Electrical Stimulation;Therapeutic activities    PT Next Visit Plan Assess HEP, core strength and flexibility    PT Home Exercise Plan [email or web address]   Consulted and Agree with Plan of Care Patient             Patient will benefit from skilled therapeutic intervention in order to improve the following deficits and impairments:  Pain, Impaired flexibility, Decreased strength, Decreased activity tolerance, Decreased range of motion, Hypomobility  Visit Diagnosis: Chronic bilateral low back pain without sciatica  Other symptoms and signs involving the musculoskeletal system  Muscle weakness (generalized)  Abnormal posture     Problem List Patient Active Problem List   Diagnosis Date Noted   Lumbar foraminal stenosis 08/31/2020   Right shoulder pain 06/17/2020   Low back pain with sciatica 06/17/2020   Eustachian tube dysfunction 01/06/2020   Cervical spondylosis 12/10/2019   Essential hypertension 10/15/2019   Hypothyroidism 10/15/2019   HLD (hyperlipidemia) 10/15/2019   Chronic low back pain 10/15/2019   Tinea pedis 10/15/2019   Well adult exam 10/15/2019   Neoplasm of uncertain behavior 083/66/2947  Neck pain 09/25/2019   JKerin Perna PTA 11/17/20 4:43 PM   [place]  [address]Phone: [phone]  Fax:  [phone] Name: Brian LeonMRN: [id number] of Birth: 223-Aug-1962

## 2020-11-18 ENCOUNTER — Encounter: Payer: BC Managed Care – PPO | Admitting: Physical Therapy

## 2020-11-25 ENCOUNTER — Encounter: Payer: BC Managed Care – PPO | Admitting: Physical Therapy

## 2020-11-25 ENCOUNTER — Ambulatory Visit: Payer: BC Managed Care – PPO | Admitting: Physical Therapy

## 2020-11-25 ENCOUNTER — Encounter: Payer: Self-pay | Admitting: Physical Therapy

## 2020-11-25 ENCOUNTER — Other Ambulatory Visit: Payer: Self-pay

## 2020-11-25 DIAGNOSIS — M545 Low back pain, unspecified: Secondary | ICD-10-CM

## 2020-11-25 DIAGNOSIS — R29898 Other symptoms and signs involving the musculoskeletal system: Secondary | ICD-10-CM | POA: Diagnosis not present

## 2020-11-25 DIAGNOSIS — R293 Abnormal posture: Secondary | ICD-10-CM | POA: Diagnosis not present

## 2020-11-25 DIAGNOSIS — G8929 Other chronic pain: Secondary | ICD-10-CM

## 2020-11-25 DIAGNOSIS — M6281 Muscle weakness (generalized): Secondary | ICD-10-CM | POA: Diagnosis not present

## 2020-11-25 NOTE — Therapy (Signed)
[place], [place], [address] Phone: [phone]   Fax:  [phone]  Physical Therapy Treatment  Patient Details  Name: Brian Russell MRN: [id number] Date of Birth: [birth year] Referring Provider (PT): Ashok Pall   Encounter Date: 11/25/2020   PT End of Session - 11/25/20 1623     Visit Number 3    Number of Visits 6    Date for PT Re-Evaluation 12/22/20    PT Start Time 6767    PT Stop Time 1706    PT Time Calculation (min) 49 min    Activity Tolerance Patient tolerated treatment well    Behavior During Therapy [facility] for tasks assessed/performed             Past Medical History:  Diagnosis Date   Hypertension    Low thyroid stimulating hormone (TSH) level     Past Surgical History:  Procedure Laterality Date   fractured bone     JOINT REPLACEMENT     SPINAL FUSION      There were no vitals filed for this visit.   Subjective Assessment - 11/25/20 1620     Subjective Pt reports no change in symptoms, "no better, no worse".    Pertinent History lumbar laminectomy and fusion L4-5    Diagnostic tests MRI shows stenosis L3-L4    Patient Stated Goals decrease pain and improve activity tolerance    Currently in Pain? Yes    Pain Score 5     Pain Location Back    Pain Orientation Lower    Pain Descriptors / Indicators Sore    Aggravating Factors  standing still    Pain Relieving Factors laying down, sitting down.                [facility] PT Assessment - 11/25/20 0001       Assessment   Medical Diagnosis intervertebral disc degeneration lumbar    Referring Provider (PT) Ashok Pall    Next MD Visit --              [facility] Adult PT Treatment/Exercise - 11/25/20 0001       Lumbar Exercises: Stretches   Hip Flexor Stretch Right;Left;2 reps;20 seconds   seated and thomas position   Hip Flexor Stretch Limitations tight LLE.  trial of arm reaching towards ceiling with  arm during seated stretch    Gastroc Stretch Left;1 rep;20 seconds   after cramping during seated hip flexor stretch   Other Lumbar Stretch Exercise sidelying thoracic rotation (open book) x 8 reps each side.      Lumbar Exercises: Aerobic   Tread Mill 3.76mph x 5 min for warm up.      Lumbar Exercises: Supine   Dead Bug 10 reps      Lumbar Exercises: Sidelying   Clam Right;Left;10 reps    Clam Limitations reverse clam x 10      Moist Heat Therapy   Number Minutes Moist Heat 10 Minutes    Moist Heat Location Lumbar Spine      Electrical Stimulation   Electrical Stimulation Location lumbar area bilat    Electrical Stimulation Action IFC    Electrical Stimulation Parameters 10 min, intensity to tolerance    Electrical Stimulation Goals Pain      Manual Therapy   Manual Therapy Soft tissue mobilization    Manual therapy comments Lt thoracic and lumbar paraspinals tighter than Rt.    Soft tissue mobilization deep STM  to Lt /Rt QL and erectors, and Lt post hip.                         PT Long Term Goals - 11/10/20 1647       PT LONG TERM GOAL #1   Title Pt will be independent in HEP    Time 6    Period Weeks    Status New    Target Date 12/22/20      PT LONG TERM GOAL #2   Title Pt will improve LE strength to 5/5 to tolerate standing with decreased pain    Time 6    Period Weeks    Status New    Target Date 12/22/20      PT LONG TERM GOAL #3   Title Pt will improve FOTO to >= 74 to demo improved functional mobility    Time 6    Period Weeks    Status New    Target Date 12/22/20      PT LONG TERM GOAL #4   Title Pt will tolerate standing still x 10 minutes with pain <= 3/10    Time 6    Period Weeks    Status New    Target Date 12/22/20                   Plan - 11/25/20 1631     Clinical Impression Statement Pt demonstrates decreased stiffness with Rt thoracic rotation.  Pt tolerated exercises without change in pain, but improvement  in flexibility. Lt lumbar musculature and hip flexor appear tighter than Rt side with exercise. Pt reports temporary relief with use of MHP/ estim at end of session.   Goals are ongoing.    Personal Factors and Comorbidities Comorbidity 1;Age    Examination-Activity Limitations Stand;Sit;Locomotion Level;Lift    Examination-Participation Restrictions Community Activity;Yard Work    Stability/Clinical Decision Making Stable/Uncomplicated    Rehab Potential Good    PT Frequency 1x / week    PT Duration 6 weeks    PT Treatment/Interventions Dry needling;Taping;Manual techniques;Patient/family education;Therapeutic exercise;Aquatic Therapy;Cryotherapy;Ultrasound;Moist Heat;Iontophoresis 4mg /ml Dexamethasone;Electrical Stimulation;Therapeutic activities    PT Next Visit Plan Assess HEP, core strength and flexibility    PT Home Exercise Plan [email or web address]    Consulted and Agree with Plan of Care Patient             Patient will benefit from skilled therapeutic intervention in order to improve the following deficits and impairments:  Pain, Impaired flexibility, Decreased strength, Decreased activity tolerance, Decreased range of motion, Hypomobility  Visit Diagnosis: Chronic bilateral low back pain without sciatica  Other symptoms and signs involving the musculoskeletal system  Muscle weakness (generalized)  Abnormal posture     Problem List Patient Active Problem List   Diagnosis Date Noted   Lumbar foraminal stenosis 08/31/2020   Right shoulder pain 06/17/2020   Low back pain with sciatica 06/17/2020   Eustachian tube dysfunction 01/06/2020   Cervical spondylosis 12/10/2019   Essential hypertension 10/15/2019   Hypothyroidism 10/15/2019   HLD (hyperlipidemia) 10/15/2019   Chronic low back pain 10/15/2019   Tinea pedis 10/15/2019   Well adult exam 10/15/2019   Neoplasm of uncertain behavior 63/14/9702   Neck pain 09/25/2019   Kerin Perna, PTA 11/25/20 6:05 PM  [place], [place], [address] Phone: [phone]   Fax:  [phone]  Name: Jamarri Vuncannon MRN: [id number] Date of  Birth: [birth year]

## 2020-12-01 ENCOUNTER — Ambulatory Visit: Payer: BC Managed Care – PPO | Admitting: Physical Therapy

## 2020-12-01 ENCOUNTER — Other Ambulatory Visit: Payer: Self-pay

## 2020-12-01 DIAGNOSIS — G8929 Other chronic pain: Secondary | ICD-10-CM | POA: Diagnosis not present

## 2020-12-01 DIAGNOSIS — M545 Low back pain, unspecified: Secondary | ICD-10-CM | POA: Diagnosis not present

## 2020-12-01 DIAGNOSIS — R29898 Other symptoms and signs involving the musculoskeletal system: Secondary | ICD-10-CM | POA: Diagnosis not present

## 2020-12-01 DIAGNOSIS — M6281 Muscle weakness (generalized): Secondary | ICD-10-CM

## 2020-12-01 NOTE — Therapy (Signed)
[place], [place], [address] Phone: [phone]   Fax:  [phone]  Physical Therapy Treatment  Patient Details  Name: Brian Russell MRN: [id number] Date of Birth: 12-19-1960 Referring Provider (PT): Ashok Pall   Encounter Date: 12/01/2020   PT End of Session - 12/01/20 1637     Visit Number 4    Number of Visits 6    Date for PT Re-Evaluation 12/22/20    PT Start Time 1600    PT Stop Time 1647    PT Time Calculation (min) 47 min    Activity Tolerance Patient tolerated treatment well    Behavior During Therapy [facility] for tasks assessed/performed             Past Medical History:  Diagnosis Date   Hypertension    Low thyroid stimulating hormone (TSH) level     Past Surgical History:  Procedure Laterality Date   fractured bone     JOINT REPLACEMENT     SPINAL FUSION      There were no vitals filed for this visit.   Subjective Assessment - 12/01/20 1559     Subjective Pt reports that he feels "more loose" but that nerve pain is unchanged    Pertinent History lumbar laminectomy and fusion L4-5    Patient Stated Goals decrease pain and improve activity tolerance    Currently in Pain? Yes    Pain Score 4     Pain Location Back    Pain Orientation Lower    Pain Descriptors / Indicators Constant    Pain Type Chronic pain                [facility] PT Assessment - 12/01/20 0001       Assessment   Medical Diagnosis intervertebral disc degeneration lumbar    Referring Provider (PT) Ashok Pall      Strength   Right Hip Extension 4+/5    Left Hip Extension 4/5                           [facility] Adult PT Treatment/Exercise - 12/01/20 0001       Lumbar Exercises: Stretches   Passive Hamstring Stretch Right;Left;2 reps;20 seconds    Passive Hamstring Stretch Limitations seated    Other Lumbar Stretch Exercise sidelying thoracic rotation (open book) x 8 reps  each side.      Lumbar Exercises: Aerobic   Tread Mill 3.1 mph x 4 min      Lumbar Exercises: Supine   Heel Slides 10 reps    Heel Slides Limitations with ab set      Lumbar Exercises: Sidelying   Clam Right;Left;20 reps    Clam Limitations reverse clam x 10 bilat      Moist Heat Therapy   Number Minutes Moist Heat 10 Minutes    Moist Heat Location Lumbar Spine      Electrical Stimulation   Electrical Stimulation Location lumbar    Electrical Stimulation Action IFC    Electrical Stimulation Parameters to tolerance    Electrical Stimulation Goals Pain      Manual Therapy   Soft tissue mobilization STM Lumbar paraspinals, QL and piriformis bilat                         PT Long Term Goals - 11/10/20 1647       PT LONG TERM  GOAL #1   Title Pt will be independent in HEP    Time 6    Period Weeks    Status New    Target Date 12/22/20      PT LONG TERM GOAL #2   Title Pt will improve LE strength to 5/5 to tolerate standing with decreased pain    Time 6    Period Weeks    Status New    Target Date 12/22/20      PT LONG TERM GOAL #3   Title Pt will improve FOTO to >= 74 to demo improved functional mobility    Time 6    Period Weeks    Status New    Target Date 12/22/20      PT LONG TERM GOAL #4   Title Pt will tolerate standing still x 10 minutes with pain <= 3/10    Time 6    Period Weeks    Status New    Target Date 12/22/20                   Plan - 12/01/20 1637     Clinical Impression Statement Pt continues with nerve pain despite improvements in strength and ROM. Pt with good response to estim at end of session and PT educated pt on home TENS unit for pain management    PT Next Visit Plan progress HEP, core strength and flexibility, manual and modalities as needed    PT Home Exercise Plan [id number]    Consulted and Agree with Plan of Care Patient             Patient will benefit from skilled therapeutic intervention in order  to improve the following deficits and impairments:     Visit Diagnosis: Chronic bilateral low back pain without sciatica  Other symptoms and signs involving the musculoskeletal system  Muscle weakness (generalized)     Problem List Patient Active Problem List   Diagnosis Date Noted   Lumbar foraminal stenosis 08/31/2020   Right shoulder pain 06/17/2020   Low back pain with sciatica 06/17/2020   Eustachian tube dysfunction 01/06/2020   Cervical spondylosis 12/10/2019   Essential hypertension 10/15/2019   Hypothyroidism 10/15/2019   HLD (hyperlipidemia) 10/15/2019   Chronic low back pain 10/15/2019   Tinea pedis 10/15/2019   Well adult exam 10/15/2019   Neoplasm of uncertain behavior 35/00/9381   Neck pain 09/25/2019  Kimila Papaleo, PT   Jo-Anne Kluth 12/01/2020, 4:39 PM  [place], [place], [address] Phone: [phone]   Fax:  [phone]  Name: Ranvir Renovato MRN: [id number] Date of Birth: 1961/03/10

## 2020-12-06 ENCOUNTER — Other Ambulatory Visit: Payer: Self-pay

## 2020-12-07 MED ORDER — HYDROCODONE-ACETAMINOPHEN 5-325 MG PO TABS: 1.0000 | ORAL_TABLET | Freq: Four times a day (QID) | ORAL | 0 refills | Status: DC | PRN

## 2020-12-09 ENCOUNTER — Encounter: Payer: Self-pay | Admitting: Physical Therapy

## 2020-12-09 ENCOUNTER — Ambulatory Visit: Payer: BC Managed Care – PPO | Admitting: Physical Therapy

## 2020-12-09 ENCOUNTER — Other Ambulatory Visit: Payer: Self-pay

## 2020-12-09 DIAGNOSIS — M6281 Muscle weakness (generalized): Secondary | ICD-10-CM

## 2020-12-09 DIAGNOSIS — R293 Abnormal posture: Secondary | ICD-10-CM

## 2020-12-09 DIAGNOSIS — M545 Low back pain, unspecified: Secondary | ICD-10-CM | POA: Diagnosis not present

## 2020-12-09 DIAGNOSIS — R29898 Other symptoms and signs involving the musculoskeletal system: Secondary | ICD-10-CM | POA: Diagnosis not present

## 2020-12-09 DIAGNOSIS — G8929 Other chronic pain: Secondary | ICD-10-CM

## 2020-12-09 NOTE — Therapy (Signed)
[place], [place], [address] Phone: [phone]   Fax:  [phone]  Physical Therapy Treatment  Patient Details  Name: Brian Russell MRN: [id number] Date of Birth: [birth year] Referring Provider (PT): Ashok Pall   Encounter Date: 12/09/2020   PT End of Session - 12/09/20 1700     Visit Number 5    Number of Visits 6    Date for PT Re-Evaluation 12/22/20    PT Start Time 1700    PT Stop Time [id number]    PT Time Calculation (min) 48 min    Activity Tolerance Patient tolerated treatment well    Behavior During Therapy [facility] for tasks assessed/performed             Past Medical History:  Diagnosis Date   Hypertension    Low thyroid stimulating hormone (TSH) level     Past Surgical History:  Procedure Laterality Date   fractured bone     JOINT REPLACEMENT     SPINAL FUSION      There were no vitals filed for this visit.   Subjective Assessment - 12/09/20 1712     Subjective Pt reports no new changes since last visit. He states he is completing HEP daily.    Pertinent History lumbar laminectomy and fusion L4-5    Patient Stated Goals decrease pain and improve activity tolerance    Currently in Pain? Yes    Pain Score 7     Pain Location Back    Pain Orientation Lower                [facility] PT Assessment - 12/09/20 0001       Assessment   Medical Diagnosis intervertebral disc degeneration lumbar    Referring Provider (PT) Ashok Pall    Next MD Visit 12/2020              [facility] Adult PT Treatment/Exercise - 12/09/20 0001       Lumbar Exercises: Stretches   Passive Hamstring Stretch Right;Left;2 reps;20 seconds   hooklying   Piriformis Stretch Right;Left;2 reps;20 seconds   supine   Other Lumbar Stretch Exercise sidelying thoracic rotation (open book) x 5 reps each side.      Lumbar Exercises: Aerobic   Tread Mill 3.0 mph x 5 min      Lumbar Exercises: Standing    Other Standing Lumbar Exercises anti-rotation with forward press x 8 reps each side, green band.    Other Standing Lumbar Exercises thoracic rotation with forearms on wall x 5 each side.  Warrior 1 with back heel lifts x 10 each      Lumbar Exercises: Seated   Other Seated Lumbar Exercises wood chop with green band x 10 each side (sitting on stool).      Lumbar Exercises: Prone   Straight Leg Raise 10 reps;2 seconds    Opposite Arm/Leg Raise Right arm/Left leg;Left arm/Right leg;5 reps, 2 sets     Moist Heat Therapy   Number Minutes Moist Heat 10 Minutes    Moist Heat Location Lumbar Spine      Electrical Stimulation   Electrical Stimulation Location bilat lumbar paraspinals    Electrical Stimulation Action IFC    Electrical Stimulation Parameters 10 min, intensity to tolerance    Electrical Stimulation Goals Pain                    PT Education - 12/09/20 1741  Education Details HEP updated.  green band issued.    Person(s) Educated Patient    Methods Explanation;Demonstration;Handout    Comprehension Verbalized understanding;Returned demonstration                 PT Long Term Goals - 12/09/20 1706       PT LONG TERM GOAL #1   Title Pt will be independent in HEP    Time 6    Period Weeks    Status On-going      PT LONG TERM GOAL #2   Title Pt will improve LE strength to 5/5 to tolerate standing with decreased pain    Time 6    Period Weeks    Status On-going      PT LONG TERM GOAL #3   Title Pt will improve FOTO to >= 74 to demo improved functional mobility    Time 6    Period Weeks    Status On-going      PT LONG TERM GOAL #4   Title Pt will tolerate standing still x 10 minutes with pain <= 3/10    Time 6    Period Weeks    Status On-going                   Plan - 12/09/20 1757     Clinical Impression Statement Pt reported that his pain level in low back remained the same throughout session, until estim/MHP at end of  session.  He tolerated new exercises well without increase in pain or symptoms.  Limited improvement in standing tolerance with less pain.    Rehab Potential Good    PT Frequency 1x / week    PT Duration 6 weeks    PT Treatment/Interventions Dry needling;Taping;Manual techniques;Patient/family education;Therapeutic exercise;Aquatic Therapy;Cryotherapy;Ultrasound;Moist Heat;Iontophoresis '4mg'$ /ml Dexamethasone;Electrical Stimulation;Therapeutic activities    PT Next Visit Plan assess response to HEP changes. end of POC. FOTO. assess need for additional sessions vs hold vs D/C    PT Home Exercise Plan [id number]    Consulted and Agree with Plan of Care Patient             Patient will benefit from skilled therapeutic intervention in order to improve the following deficits and impairments:  Pain, Impaired flexibility, Decreased strength, Decreased activity tolerance, Decreased range of motion, Hypomobility  Visit Diagnosis: Chronic bilateral low back pain without sciatica  Other symptoms and signs involving the musculoskeletal system  Muscle weakness (generalized)  Abnormal posture     Problem List Patient Active Problem List   Diagnosis Date Noted   Lumbar foraminal stenosis 08/31/2020   Right shoulder pain 06/17/2020   Low back pain with sciatica 06/17/2020   Eustachian tube dysfunction 01/06/2020   Cervical spondylosis 12/10/2019   Essential hypertension 10/15/2019   Hypothyroidism 10/15/2019   HLD (hyperlipidemia) 10/15/2019   Chronic low back pain 10/15/2019   Tinea pedis 10/15/2019   Well adult exam 10/15/2019   Neoplasm of uncertain behavior [id number]   Neck pain 09/25/2019   Kerin Perna, PTA 12/09/20 6:01 PM  [facility] [place], [place], [address] Phone: [phone]   Fax:  [phone]  Name: Brian Russell MRN: [id number] Date of Birth: 1960-08-31

## 2020-12-09 NOTE — Patient Instructions (Signed)
Access Code: [email or web address] URL: [email or web address] Date: 12/09/2020 Prepared by: [place]  Exercises Supine Dead Bug with Leg Extension - 1 x daily - 2 x weekly - 1 sets - 10 reps Clamshell - 1 x daily - 7 x weekly - 3 sets - 10 reps Prone Alternating Arm and Leg Lifts - 1 x daily - 2 x weekly - 1 sets - 10 reps Seated Diagonal Chops with Resistance - 1 x daily - 3 x weekly - 1-2 sets - 10 reps Standing Anti-Rotation Press with Anchored Resistance - 1 x daily - 2-3 x weekly - 1 sets - 10 reps Seated Hamstring Stretch - 1 x daily - 7 x weekly - 3 sets - 1 reps - 15-30 seconds hold Sidelying Thoracic Rotation with Open Book - 1 x daily - 7 x weekly - 1 sets - 5 reps - 5-10 seconds hold Supine Piriformis Stretch with Foot on Ground - 1 x daily - 7 x weekly - 1 sets - 2 reps - 15 seconds hold

## 2020-12-15 ENCOUNTER — Encounter: Payer: BC Managed Care – PPO | Admitting: Physical Therapy

## 2020-12-23 ENCOUNTER — Ambulatory Visit: Payer: BC Managed Care – PPO | Admitting: Physical Therapy

## 2020-12-23 ENCOUNTER — Other Ambulatory Visit: Payer: Self-pay

## 2020-12-23 DIAGNOSIS — M6281 Muscle weakness (generalized): Secondary | ICD-10-CM

## 2020-12-23 DIAGNOSIS — R293 Abnormal posture: Secondary | ICD-10-CM | POA: Diagnosis not present

## 2020-12-23 DIAGNOSIS — R29898 Other symptoms and signs involving the musculoskeletal system: Secondary | ICD-10-CM

## 2020-12-23 DIAGNOSIS — M545 Low back pain, unspecified: Secondary | ICD-10-CM

## 2020-12-23 DIAGNOSIS — G8929 Other chronic pain: Secondary | ICD-10-CM

## 2020-12-23 NOTE — Therapy (Addendum)
[place], [place], [address] Phone: [phone]   Fax:  [phone]  Physical Therapy Treatment and Recertification  Patient Details  Name: Brian Russell MRN: [id number] Date of Birth: 1960/05/23 Referring Provider (PT): Ashok Pall   Encounter Date: 12/23/2020   PT End of Session - 12/23/20 1623     Visit Number 6    Number of Visits 12    Date for PT Re-Evaluation 02/02/21    PT Start Time 7412    PT Stop Time 1703    PT Time Calculation (min) 46 min    Activity Tolerance Patient tolerated treatment well    Behavior During Therapy [facility] for tasks assessed/performed             Past Medical History:  Diagnosis Date   Hypertension    Low thyroid stimulating hormone (TSH) level     Past Surgical History:  Procedure Laterality Date   fractured bone     JOINT REPLACEMENT     SPINAL FUSION      There were no vitals filed for this visit.   Subjective Assessment - 12/23/20 1624     Subjective Pt reports he woke up with increased back pain. He did some stretches this morning.  He has bought a TENS unit but it has not arrived yet.  He reports he has had some improvement, but today feels like a backslide.    Pertinent History lumbar laminectomy and fusion L4-5    How long can you stand comfortably? < 3-10 minute    Diagnostic tests MRI shows stenosis L3-L4    Patient Stated Goals decrease pain and improve activity tolerance    Currently in Pain? Yes    Pain Score 10-Worst pain ever    Pain Location Back    Pain Orientation Lower   L>R   Pain Descriptors / Indicators Aching;Sharp    Aggravating Factors  standing still    Pain Relieving Factors laying down, TENS                [facility] PT Assessment - 12/24/20 0001       Assessment   Next MD Visit 01/03/21                            PT Long Term Goals - 12/23/20 1622       PT LONG TERM GOAL #1   Title  Pt will be independent in HEP    Time 6    Period Weeks    Status Achieved      PT LONG TERM GOAL #2   Title Pt will improve LE strength to 5/5 to tolerate standing with decreased pain    Time 6    Period Weeks    Status Partially Met      PT LONG TERM GOAL #3   Title Pt will improve FOTO to >= 74 to demo improved functional mobility    Time 6    Period Weeks    Status On-going      PT LONG TERM GOAL #4   Title Pt will tolerate standing still x 10 minutes with pain <= 3/10    Time 6    Period Weeks    Status On-going                   Plan - 12/23/20 1638     Clinical Impression Statement  Pt demonstrated improved bilat hip strength; has partially met his strength goal.  Pt participated well throughout despite arriving with elevated pain level.  Warrior 1 heel raises increased back pain. Pt reported that the exercises temporarily provide distraction from the pain he arrived with; some increase in pain after completion of exercises.   Pain reduced with use of estim at end of session. FOTO score worsened, likely in response to elevated pain level today.  Pt has partially met his goals and will benefit from continued PT intervention to max functional mobility with less pain.    Rehab Potential Good    PT Frequency 1x / week    PT Duration 6 weeks    PT Treatment/Interventions Dry needling;Taping;Manual techniques;Patient/family education;Therapeutic exercise;Aquatic Therapy;Cryotherapy;Ultrasound;Moist Heat;Iontophoresis 58m/ml Dexamethasone;Electrical Stimulation;Therapeutic activities    PT Next Visit Plan continue core and hip strengthening.    PT Home Exercise Plan [email or web address]   Consulted and Agree with Plan of Care Patient             Patient will benefit from skilled therapeutic intervention in order to improve the following deficits and impairments:  Pain, Impaired flexibility, Decreased strength, Decreased activity tolerance, Decreased range of motion,  Hypomobility  Visit Diagnosis: Chronic bilateral low back pain without sciatica - Plan: PT plan of care cert/re-cert  Other symptoms and signs involving the musculoskeletal system - Plan: PT plan of care cert/re-cert  Muscle weakness (generalized) - Plan: PT plan of care cert/re-cert  Abnormal posture - Plan: PT plan of care cert/re-cert     Problem List Patient Active Problem List   Diagnosis Date Noted   Lumbar foraminal stenosis 08/31/2020   Right shoulder pain 06/17/2020   Low back pain with sciatica 06/17/2020   Eustachian tube dysfunction 01/06/2020   Cervical spondylosis 12/10/2019   Essential hypertension 10/15/2019   Hypothyroidism 10/15/2019   HLD (hyperlipidemia) 10/15/2019   Chronic low back pain 10/15/2019   Tinea pedis 10/15/2019   Well adult exam 10/15/2019   Neoplasm of uncertain behavior 074/93/5521  Neck pain 09/25/2019  DIsabelle Course PT  JKerin Perna PTA 12/24/20 3:02 PM   [place] [address]Phone: [phone]  Fax:  [phone] Name: Brian NickersonMRN: [id number] of Birth: [birth year]

## 2020-12-24 NOTE — Addendum Note (Signed)
Addended by: Kennith Gain on: 12/24/2020 03:02 PM   Modules accepted: Orders

## 2020-12-29 ENCOUNTER — Other Ambulatory Visit: Payer: Self-pay

## 2020-12-29 ENCOUNTER — Ambulatory Visit: Payer: BC Managed Care – PPO | Admitting: Physical Therapy

## 2020-12-29 DIAGNOSIS — M545 Low back pain, unspecified: Secondary | ICD-10-CM | POA: Diagnosis not present

## 2020-12-29 DIAGNOSIS — R293 Abnormal posture: Secondary | ICD-10-CM | POA: Diagnosis not present

## 2020-12-29 DIAGNOSIS — R29898 Other symptoms and signs involving the musculoskeletal system: Secondary | ICD-10-CM

## 2020-12-29 DIAGNOSIS — G8929 Other chronic pain: Secondary | ICD-10-CM

## 2020-12-29 DIAGNOSIS — M6281 Muscle weakness (generalized): Secondary | ICD-10-CM

## 2020-12-29 NOTE — Therapy (Addendum)
[place], [place], [address] Phone: [phone]   Fax:  [phone]  Physical Therapy Treatment and Discharge  Patient Details  Name: Brian Russell MRN: [id number] Date of Birth: June 25, 1960 Referring Provider (PT): Ashok Pall   Encounter Date: 12/29/2020   PT End of Session - 12/29/20 1443     Visit Number 7    Number of Visits 12    Date for PT Re-Evaluation 02/02/21    PT Start Time 1435    PT Stop Time 1520    PT Time Calculation (min) 45 min    Activity Tolerance Patient tolerated treatment well    Behavior During Therapy [facility] for tasks assessed/performed             Past Medical History:  Diagnosis Date   Hypertension    Low thyroid stimulating hormone (TSH) level     Past Surgical History:  Procedure Laterality Date   fractured bone     JOINT REPLACEMENT     SPINAL FUSION      There were no vitals filed for this visit.   Subjective Assessment - 12/29/20 1444     Subjective Pt reports he has had significant pain in Lt low back to hip.  Using heating pad for some relief.  He only gets relief is laying down. He returns to MD on Monday.    Pertinent History lumbar laminectomy and fusion L4-5    How long can you stand comfortably? < 3-10 minute    Diagnostic tests MRI shows stenosis L3-L4    Patient Stated Goals decrease pain and improve activity tolerance    Currently in Pain? Yes    Pain Score 8     Pain Location Back    Pain Orientation Left;Lower - into Lt glute   Pain Descriptors / Indicators Burning    Aggravating Factors  standing    Pain Relieving Factors laying down, TENS                [facility] PT Assessment - 12/29/20 0001       Assessment   Medical Diagnosis intervertebral disc degeneration lumbar    Referring Provider (PT) Ashok Pall    Hand Dominance Right    Next MD Visit 01/03/21              [facility] Adult PT Treatment/Exercise - 12/29/20  0001       Lumbar Exercises: Stretches   Hip Flexor Stretch Left;2 reps;Right;1 rep;30 seconds    Piriformis Stretch Left;2 reps;Right;1 rep;30 seconds    Other Lumbar Stretch Exercise LLE nerve glide with towel assist behind knee x 10 Rt sidelying over bolster to gently stretch Lt QL x 1.5 min.      Lumbar Exercises: Aerobic   Recumbent Bike L1-2: 5 min for warm up.      Lumbar Exercises: Supine   Bridge Limitations bridge isometric - with arm press x 5 sec x 8 reps    Other Supine Lumbar Exercises decompression series - including leg press x 5 x 10 reps, leg / arm lengthener x 5 sec x 10.      Arboriculturist Action IFC    Electrical Stimulation Parameters 15 min, intensity to tolerance    Electrical Stimulation Goals Pain               PT Long Term Goals - 12/23/20 1622  PT LONG TERM GOAL #1   Title Pt will be independent in HEP    Time 6    Period Weeks    Status Achieved      PT LONG TERM GOAL #2   Title Pt will improve LE strength to 5/5 to tolerate standing with decreased pain    Time 6    Period Weeks    Status Partially Met      PT LONG TERM GOAL #3   Title Pt will improve FOTO to >= 74 to demo improved functional mobility    Time 6    Period Weeks    Status On-going      PT LONG TERM GOAL #4   Title Pt will tolerate standing still x 10 minutes with pain <= 3/10    Time 6    Period Weeks    Status On-going                   Plan - 12/29/20 1450     Clinical Impression Statement Pt with limited tolerance for exercises due to elevated pain level in back.  Exercises kept gentle to avoid increased irritation.  Limited progress over last weeke towards goals.    Rehab Potential Good    PT Frequency 1x / week    PT Duration 6 weeks    PT Treatment/Interventions Dry needling;Taping;Manual techniques;Patient/family education;Therapeutic exercise;Aquatic  Therapy;Cryotherapy;Ultrasound;Moist Heat;Iontophoresis 80m/ml Dexamethasone;Electrical Stimulation;Therapeutic activities    PT Next Visit Plan await info from upcoming MD appt.    PT Home Exercise Plan [email or web address]   Consulted and Agree with Plan of Care Patient             Patient will benefit from skilled therapeutic intervention in order to improve the following deficits and impairments:  Pain, Impaired flexibility, Decreased strength, Decreased activity tolerance, Decreased range of motion, Hypomobility  Visit Diagnosis: Chronic bilateral low back pain without sciatica  Other symptoms and signs involving the musculoskeletal system  Abnormal posture  Muscle weakness (generalized)     Problem List Patient Active Problem List   Diagnosis Date Noted   Lumbar foraminal stenosis 08/31/2020   Right shoulder pain 06/17/2020   Low back pain with sciatica 06/17/2020   Eustachian tube dysfunction 01/06/2020   Cervical spondylosis 12/10/2019   Essential hypertension 10/15/2019   Hypothyroidism 10/15/2019   HLD (hyperlipidemia) 10/15/2019   Chronic low back pain 10/15/2019   Tinea pedis 10/15/2019   Well adult exam 10/15/2019   Neoplasm of uncertain behavior 085/88/5027  Neck pain 09/25/2019   PHYSICAL THERAPY DISCHARGE SUMMARY  Visits from Start of Care: 7  Current functional level related to goals / functional outcomes: Improved strength   Remaining deficits: See above   Education / Equipment: HEP   Patient agrees to discharge. Patient goals were partially met. Patient is being discharged due to not returning since the last visit. KIsabelle Course PT,DPT09/14/2210:13 AM   JKerin Perna PTA 12/29/20 3:14 PM   [place] [address]Phone: [phone]  Fax:  [phone] Name: RArnol McgibbonMRN: [id number] of Birth: [birth year]

## 2021-01-03 DIAGNOSIS — M5136 Other intervertebral disc degeneration, lumbar region: Secondary | ICD-10-CM | POA: Diagnosis not present

## 2021-01-03 DIAGNOSIS — Z6829 Body mass index (BMI) 29.0-29.9, adult: Secondary | ICD-10-CM | POA: Diagnosis not present

## 2021-01-03 DIAGNOSIS — I10 Essential (primary) hypertension: Secondary | ICD-10-CM | POA: Diagnosis not present

## 2021-01-13 DIAGNOSIS — M48062 Spinal stenosis, lumbar region with neurogenic claudication: Secondary | ICD-10-CM | POA: Diagnosis not present

## 2021-01-26 ENCOUNTER — Encounter: Payer: Self-pay | Admitting: Family Medicine

## 2021-01-26 ENCOUNTER — Ambulatory Visit: Payer: BC Managed Care – PPO | Admitting: Family Medicine

## 2021-01-26 VITALS — BP 125/75 | HR 61 | Temp 98.5°F | Ht 71.0 in | Wt 206.0 lb

## 2021-01-26 DIAGNOSIS — I10 Essential (primary) hypertension: Secondary | ICD-10-CM

## 2021-01-26 DIAGNOSIS — M47812 Spondylosis without myelopathy or radiculopathy, cervical region: Secondary | ICD-10-CM

## 2021-01-26 DIAGNOSIS — R42 Dizziness and giddiness: Secondary | ICD-10-CM

## 2021-01-26 MED ORDER — PREDNISONE 10 MG (48) PO TBPK: ORAL_TABLET | ORAL | 0 refills | Status: DC

## 2021-01-30 DIAGNOSIS — R42 Dizziness and giddiness: Secondary | ICD-10-CM | POA: Insufficient documentation

## 2021-01-30 NOTE — Assessment & Plan Note (Signed)
Blood pressure stable at this time.  Continue current medications for management of hypertension.

## 2021-01-30 NOTE — Assessment & Plan Note (Signed)
He is having some increased neck pain.  Adding prednisone.  Follow-up if no improvement with this.

## 2021-01-30 NOTE — Progress Notes (Signed)
Brian Russell - 60 y.o. male MRN [id number]  Date of birth: Apr 22, 1961  Subjective Chief Complaint  Patient presents with   Tinnitus   Neck Pain    HPI Brian Russell is a 60 year old male here today for complaint of of neck pain, tinnitus and dizziness.  He does have long history of tinnitus as well as some issues with recurrent neck pain however most recent episode started a few days ago.  Dizziness is worse with standing up and sudden head movements, especially looking over his right shoulder.  This also makes his neck pain worse with looking over his right shoulder.  Pain does not radiate into the shoulder.  He denies chest pain, shortness of breath, palpitations and nausea.  ROS:  A comprehensive ROS was completed and negative except as noted per HPI  Allergies  Allergen Reactions   Amlodipine    Buspar [Buspirone]     Past Medical History:  Diagnosis Date   Hypertension    Low thyroid stimulating hormone (TSH) level     Past Surgical History:  Procedure Laterality Date   fractured bone     JOINT REPLACEMENT     SPINAL FUSION      Social History   Socioeconomic History   Marital status: Married    Spouse name: Not on file   Number of children: Not on file   Years of education: Not on file   Highest education level: Not on file  Occupational History   Occupation: Product manager  Tobacco Use   Smoking status: Former    Types: Cigarettes    Quit date: 05/2011    Years since quitting: 9.7   Smokeless tobacco: Never  Vaping Use   Vaping Use: Never used  Substance and Sexual Activity   Alcohol use: Yes    Alcohol/week: 2.0 standard drinks    Types: 2 Standard drinks or equivalent per week   Drug use: Never   Sexual activity: Yes    Partners: Female  Other Topics Concern   Not on file  Social History Narrative   Not on file   Social Determinants of Health   Financial Resource Strain: Not on file  Food Insecurity: Not on file  Transportation Needs: Not on  file  Physical Activity: Not on file  Stress: Not on file  Social Connections: Not on file    Family History  Problem Relation Age of Onset   Hypertension Other    Heart attack Other    Stroke Other    Alzheimer's disease Mother    Stroke Father     Health Maintenance  Topic Date Due   HIV Screening  Never done   Hepatitis C Screening  Never done   Zoster Vaccines- Shingrix (1 of 2) Never done   COVID-19 Vaccine (3 - Booster for Steele series) 01/06/2020   INFLUENZA VACCINE  12/13/2020   TETANUS/TDAP  07/24/2021   COLONOSCOPY (Pts 45-16yr Insurance coverage will need to be confirmed)  12/15/2027   HPV VACCINES  Aged Out     ----------------------------------------------------------------------------------------------------------------------------------------------------------------------------------------------------------------- Physical Exam BP 125/75 (BP Location: Left Arm, Patient Position: Sitting, Cuff Size: Large)   Pulse 61   Temp 98.5 F (36.9 C)   Ht '5\' 11"'$  (1.803 m)   Wt 206 lb (93.4 kg)   SpO2 99%   BMI 28.73 kg/m   Physical Exam Constitutional:      Appearance: Normal appearance.  HENT:     Head: Normocephalic and atraumatic.     Right Ear:  Tympanic membrane normal.     Left Ear: Tympanic membrane normal.  Eyes:     General: No scleral icterus. Cardiovascular:     Rate and Rhythm: Normal rate and regular rhythm.  Pulmonary:     Effort: Pulmonary effort is normal.     Breath sounds: Normal breath sounds.  Musculoskeletal:     Cervical back: Neck supple.  Neurological:     General: No focal deficit present.     Mental Status: He is alert and oriented to person, place, and time.     Cranial Nerves: No cranial nerve deficit.     Motor: No weakness.  Psychiatric:        Mood and Affect: Mood normal.        Behavior: Behavior normal.     ------------------------------------------------------------------------------------------------------------------------------------------------------------------------------------------------------------------- Assessment and Plan  Essential hypertension Blood pressure stable at this time.  Continue current medications for management of hypertension.  Cervical spondylosis He is having some increased neck pain.  Adding prednisone.  Follow-up if no improvement with this.  Vertigo Recommend adding an over-the-counter meclizine as needed.  Referral placed to physical therapy for vestibular rehab.   Meds ordered this encounter  Medications   predniSONE (STERAPRED UNI-PAK 48 TAB) 10 MG (48) TBPK tablet    Sig: Taper as directed on packaging    Dispense:  48 tablet    Refill:  0    No follow-ups on file.    This visit occurred during the SARS-CoV-2 public health emergency.  Safety protocols were in place, including screening questions prior to the visit, additional usage of staff PPE, and extensive cleaning of exam room while observing appropriate contact time as indicated for disinfecting solutions.

## 2021-01-30 NOTE — Assessment & Plan Note (Signed)
Recommend adding an over-the-counter meclizine as needed.  Referral placed to physical therapy for vestibular rehab.

## 2021-02-07 ENCOUNTER — Encounter: Payer: Self-pay | Admitting: Rehabilitative and Restorative Service Providers"

## 2021-02-07 ENCOUNTER — Other Ambulatory Visit: Payer: Self-pay

## 2021-02-07 ENCOUNTER — Ambulatory Visit: Payer: BC Managed Care – PPO | Admitting: Rehabilitative and Restorative Service Providers"

## 2021-02-07 DIAGNOSIS — R42 Dizziness and giddiness: Secondary | ICD-10-CM

## 2021-02-07 NOTE — Patient Instructions (Signed)
Access Code: [email or web address] URL: [email or web address] Date: 02/07/2021 Prepared by: Rudell Cobb  Exercises Standing Gaze Stabilization with Head Rotation - 2 x daily - 7 x weekly - 1 sets - 10 reps

## 2021-02-07 NOTE — Therapy (Addendum)
[place], [place], [address] Phone: [phone]   Fax:  [phone]  Physical Therapy Evaluation/Discharge Summary  Patient Details  Name: Brian Russell MRN: [id number] Date of Birth: 1961/01/30 Referring Provider (PT): [name], DO   Patient did not return to PT.  Please refer to initial evaluation for patient status. Thank you for the referral of this patient. Rudell Cobb, MPT  Encounter Date: 02/07/2021   PT End of Session - 02/07/21 2121     Visit Number 1    Number of Visits 6    Date for PT Re-Evaluation 03/21/21    Authorization Type [employer]    Authorization - Visit Number 8    Authorization - Number of Visits 30   for the year   Progress Note Due on Visit 10    PT Start Time 1518    PT Stop Time 1600    PT Time Calculation (min) 42 min             Past Medical History:  Diagnosis Date   Hypertension    Low thyroid stimulating hormone (TSH) level     Past Surgical History:  Procedure Laterality Date   fractured bone     JOINT REPLACEMENT     SPINAL FUSION      There were no vitals filed for this visit.    Subjective Assessment - 02/07/21 1521     Subjective The patient reports h/o intermittent vertigo that would come/go in the past and last x 2 days and then improve.  He notes worsening dizziness over the past few weeks with head pressure and tinnitus.  Symptoms are constant in nature "feeling like a buzz" sensation.   Symptoms initially worsened with meloxicam use, but he changed dosing and hasn't noticed a difference.  He denies nausea, notes mild HA, and notes some feelings of anxiousness due to sensation.  Tinnitus in his ears has been present x years.    Pertinent History lumbar laminectomy and fusion L4-5    Patient Stated Goals sensation of dizziness resolved.    Currently in Pain? Yes    Pain Score --   h/o chronic neck and back pain   Pain Location  Back    Effect of Pain on Daily Activities *PT to monitor, but no goal to follow due to nature of referral.                [facility] PT Assessment - 02/07/21 1530       Assessment   Medical Diagnosis vertigo    Referring Provider (PT) [name], DO    Hand Dominance Right      Precautions   Precautions None      Restrictions   Weight Bearing Restrictions No      Balance Screen   Has the patient fallen in the past 6 months No    Has the patient had a decrease in activity level because of a fear of falling?  No    Is the patient reluctant to leave their home because of a fear of falling?  No      Prior Function   Level of Independence Independent      Observation/Other Assessments   Focus on Therapeutic Outcomes (FOTO)  58%                    Vestibular Assessment - 02/07/21 1531       Symptom Behavior  Type of Dizziness  Imbalance;Lightheadedness    Frequency of Dizziness daily    Duration of Dizziness constant    Symptom Nature Constant    Aggravating Factors Activity in general    Relieving Factors No known relieving factors      Oculomotor Exam   Oculomotor Alignment Normal    Ocular ROM --   WFLs   Spontaneous Absent    Gaze-induced  Absent    Smooth Pursuits Intact    Saccades Intact      Vestibulo-Ocular Reflex   VOR 1 Head Only (x 1 viewing) Slow VOR= intact without dizziness    Comment low amplitude refixation saccade with bilat head impulse testing      Visual Acuity   Static line 9    Dynamic line 5   indicates dec'd VOR -- 2 line difference is normal     Positional Testing   Dix-Hallpike Dix-Hallpike Right;Dix-Hallpike Left    Sidelying Test Sidelying Right;Sidelying Left    Horizontal Canal Testing Horizontal Canal Right;Horizontal Canal Left      Dix-Hallpike Right   Dix-Hallpike Right Duration none    Dix-Hallpike Right Symptoms No nystagmus      Dix-Hallpike Left   Dix-Hallpike Left Duration none    Dix-Hallpike Left  Symptoms No nystagmus      Sidelying Right   Sidelying Right Duration none    Sidelying Right Symptoms No nystagmus      Sidelying Left   Sidelying Left Duration none    Sidelying Left Symptoms No nystagmus      Horizontal Canal Right   Horizontal Canal Right Duration none    Horizontal Canal Right Symptoms Normal      Horizontal Canal Left   Horizontal Canal Left Duration none    Horizontal Canal Left Symptoms Normal      Positional Sensitivities   Positional Sensitivities Comments No positional component noted today                Objective measurements completed on examination: See above findings.        Vestibular Treatment/Exercise - 02/07/21 1546       Vestibular Treatment/Exercise   Vestibular Treatment Provided Gaze    Gaze Exercises X1 Viewing Horizontal      X1 Viewing Horizontal   Foot Position standing    Comments 30 seconds with cues on amplitude of motion and gaze fixation                    PT Education - 02/07/21 1559     Education Details initiated HEP for gaze adaptation    Person(s) Educated Patient    Methods Explanation;Demonstration;Handout    Comprehension Verbalized understanding;Returned demonstration                 PT Long Term Goals - 02/07/21 2124       PT LONG TERM GOAL #1   Title The patient will return demo HEP for gaze adaptation and habituation as indicated.    Time 6    Period Weeks    Target Date 03/21/21      PT LONG TERM GOAL #2   Title The patient will perform gaze x 1 adaptation exercises x 60 seconds without c/o visual movement.    Time 6    Period Weeks    Target Date 03/21/21      PT LONG TERM GOAL #3   Title The patient will imrpove FOTO from 58% functional status score up to  62%.    Time 6    Period Weeks    Target Date 03/21/21      PT LONG TERM GOAL #4   Title The patient will report reduced dizziness by > or equal to 50%.    Time 6    Period Weeks    Target Date 03/21/21                     Plan - 02/07/21 2127     Clinical Impression Statement The patient is a 60 yo male presenting to OP physical therapy with h/o intermittent vertigo.  He recently noticed a more consistent, worsening sensation of dizziness. He also recently got new eyeweear/lenses, however he thinks symptoms began prior to changing lenses.  At today's evaluation, he has a positive head impulse test and 4 line difference on static versus dynamic visual acuity indicating dec'd VOR.  VOR is lens dependent, therefore PT encouraged him to work on gaze adaptation x 2 weeks and return for follow-up.  Positional testing did not worsen symptoms today.    Personal Factors and Comorbidities Comorbidity 1    Comorbidities lumbar laminectomy and fusion    Examination-Activity Limitations Locomotion Level    Examination-Participation Restrictions Occupation;Community Activity    Stability/Clinical Decision Making Stable/Uncomplicated    Clinical Decision Making Low    Rehab Potential Good    PT Frequency 1x / week    PT Duration 6 weeks    PT Treatment/Interventions Vestibular;Canalith Repostioning;Neuromuscular re-education;Patient/family education;ADLs/Self Care Home Management    PT Next Visit Plan Check gaze HEP, progress gaze activities    PT Home Exercise Plan [email or web address]    Consulted and Agree with Plan of Care Patient             Patient will benefit from skilled therapeutic intervention in order to improve the following deficits and impairments:  Dizziness, Decreased activity tolerance  Visit Diagnosis: Dizziness and giddiness     Problem List Patient Active Problem List   Diagnosis Date Noted   Vertigo 01/30/2021   Lumbar foraminal stenosis 08/31/2020   Right shoulder pain 06/17/2020   Low back pain with sciatica 06/17/2020   Eustachian tube dysfunction 01/06/2020   Cervical spondylosis 12/10/2019   Essential hypertension 10/15/2019   Hypothyroidism 10/15/2019   HLD  (hyperlipidemia) 10/15/2019   Chronic low back pain 10/15/2019   Tinea pedis 10/15/2019   Well adult exam 10/15/2019   Neoplasm of uncertain behavior 48/05/6551   Neck pain 09/25/2019    Dammon Makarewicz, PT 02/07/2021, 9:33 PM  [facility] [place], [place], [address] Phone: [phone]   Fax:  [phone]  Name: Brian Russell MRN: [id number] Date of Birth: 01-24-1961

## 2021-02-08 ENCOUNTER — Other Ambulatory Visit: Payer: Self-pay | Admitting: Family Medicine

## 2021-02-08 MED ORDER — HYDROCODONE-ACETAMINOPHEN 5-325 MG PO TABS: 1.0000 | ORAL_TABLET | Freq: Four times a day (QID) | ORAL | 0 refills | Status: DC | PRN

## 2021-02-14 DIAGNOSIS — M48062 Spinal stenosis, lumbar region with neurogenic claudication: Secondary | ICD-10-CM | POA: Diagnosis not present

## 2021-02-17 ENCOUNTER — Ambulatory Visit: Payer: BC Managed Care – PPO | Admitting: Family Medicine

## 2021-02-17 ENCOUNTER — Encounter: Payer: Self-pay | Admitting: Family Medicine

## 2021-02-17 VITALS — BP 130/90 | HR 69 | Temp 98.0°F | Ht 71.0 in | Wt 208.0 lb

## 2021-02-17 DIAGNOSIS — G4452 New daily persistent headache (NDPH): Secondary | ICD-10-CM | POA: Diagnosis not present

## 2021-02-17 DIAGNOSIS — R42 Dizziness and giddiness: Secondary | ICD-10-CM | POA: Diagnosis not present

## 2021-02-17 MED ORDER — TRIAZOLAM 0.25 MG PO TABS: ORAL_TABLET | ORAL | 0 refills | Status: DC

## 2021-02-17 MED ORDER — MECLIZINE HCL 25 MG PO TABS: 25.0000 mg | ORAL_TABLET | Freq: Three times a day (TID) | ORAL | 0 refills | Status: DC | PRN

## 2021-02-17 NOTE — Assessment & Plan Note (Signed)
Adding meclizine as needed.  He will continue vestibular rehab.  MRI ordered due to continued and worsening symptoms along with new onset headaches.

## 2021-02-17 NOTE — Patient Instructions (Signed)
Try meclizine every 8 hours as needed for dizziness.  Take triazolam 1 hour prior to MRI.  Be sure you have someone to drive you to and from procedure.

## 2021-02-17 NOTE — Progress Notes (Signed)
Brian Russell - 60 y.o. male MRN [id number]  Date of birth: 1961/03/23  Subjective Chief Complaint  Patient presents with   Dizziness    HPI Brian Russell is a 60 year old male here today for follow-up of dizziness and headaches.  He continues to have continuous dizziness that is vertiginous in nature.  He also has had pressure and fullness in his upper head.  He also describes a buzzing sensation in his ear.  This is worse in his right ear.  He has tried nasal sprays for eustachian tube dysfunction and most recently was seen by physical therapy recently for vestibular rehab.  He has only had 1 session so far.  ROS:  A comprehensive ROS was completed and negative except as noted per HPI  Allergies  Allergen Reactions   Amlodipine    Buspar [Buspirone]     Past Medical History:  Diagnosis Date   Hypertension    Low thyroid stimulating hormone (TSH) level     Past Surgical History:  Procedure Laterality Date   fractured bone     JOINT REPLACEMENT     SPINAL FUSION      Social History   Socioeconomic History   Marital status: Married    Spouse name: Not on file   Number of children: Not on file   Years of education: Not on file   Highest education level: Not on file  Occupational History   Occupation: Product manager  Tobacco Use   Smoking status: Former    Types: Cigarettes    Quit date: 05/2011    Years since quitting: 9.7   Smokeless tobacco: Never  Vaping Use   Vaping Use: Never used  Substance and Sexual Activity   Alcohol use: Yes    Alcohol/week: 2.0 standard drinks    Types: 2 Standard drinks or equivalent per week   Drug use: Never   Sexual activity: Yes    Partners: Female  Other Topics Concern   Not on file  Social History Narrative   Not on file   Social Determinants of Health   Financial Resource Strain: Not on file  Food Insecurity: Not on file  Transportation Needs: Not on file  Physical Activity: Not on file  Stress: Not on file  Social  Connections: Not on file    Family History  Problem Relation Age of Onset   Hypertension Other    Heart attack Other    Stroke Other    Alzheimer's disease Mother    Stroke Father     Health Maintenance  Topic Date Due   HIV Screening  Never done   Hepatitis C Screening  Never done   Zoster Vaccines- Shingrix (1 of 2) Never done   COVID-19 Vaccine (3 - Booster for [place] series) 01/06/2020   INFLUENZA VACCINE  12/13/2020   TETANUS/TDAP  07/24/2021   COLONOSCOPY (Pts 45-70yrs Insurance coverage will need to be confirmed)  12/15/2027   HPV VACCINES  Aged Out     ----------------------------------------------------------------------------------------------------------------------------------------------------------------------------------------------------------------- Physical Exam BP 130/90 (BP Location: Left Arm, Patient Position: Sitting, Cuff Size: Normal)   Pulse 69   Temp 98 F (36.7 C)   Ht 5\' 11"  (1.803 m)   Wt 208 lb (94.3 kg)   SpO2 99%   BMI 29.01 kg/m   Physical Exam Constitutional:      Appearance: Normal appearance.  HENT:     Head: Normocephalic and atraumatic.     Comments: No temporal tenderness. Eyes:     General: No scleral  icterus. Cardiovascular:     Rate and Rhythm: Normal rate and regular rhythm.  Pulmonary:     Effort: Pulmonary effort is normal.     Breath sounds: Normal breath sounds.  Musculoskeletal:     Cervical back: Normal range of motion and neck supple.  Neurological:     General: No focal deficit present.     Mental Status: He is alert.     Cranial Nerves: No cranial nerve deficit.    ------------------------------------------------------------------------------------------------------------------------------------------------------------------------------------------------------------------- Assessment and Plan  Vertigo Adding meclizine as needed.  He will continue vestibular rehab.  MRI ordered due to continued and  worsening symptoms along with new onset headaches.   Meds ordered this encounter  Medications   meclizine (ANTIVERT) 25 MG tablet    Sig: Take 1 tablet (25 mg total) by mouth 3 (three) times daily as needed for dizziness.    Dispense:  30 tablet    Refill:  0   triazolam (HALCION) 0.25 MG tablet    Sig: Take 1 hour prior to MRI    Dispense:  1 tablet    Refill:  0    No follow-ups on file.    This visit occurred during the SARS-CoV-2 public health emergency.  Safety protocols were in place, including screening questions prior to the visit, additional usage of staff PPE, and extensive cleaning of exam room while observing appropriate contact time as indicated for disinfecting solutions.

## 2021-02-23 ENCOUNTER — Encounter: Payer: BC Managed Care – PPO | Admitting: Physical Therapy

## 2021-02-27 ENCOUNTER — Ambulatory Visit (INDEPENDENT_AMBULATORY_CARE_PROVIDER_SITE_OTHER): Payer: BC Managed Care – PPO

## 2021-02-27 ENCOUNTER — Other Ambulatory Visit: Payer: Self-pay

## 2021-02-27 DIAGNOSIS — R42 Dizziness and giddiness: Secondary | ICD-10-CM | POA: Diagnosis not present

## 2021-02-27 DIAGNOSIS — G4452 New daily persistent headache (NDPH): Secondary | ICD-10-CM | POA: Diagnosis not present

## 2021-02-27 DIAGNOSIS — R519 Headache, unspecified: Secondary | ICD-10-CM | POA: Diagnosis not present

## 2021-02-27 IMAGING — MR MR HEAD W/O CM
10 series · 48 of 48 positions shown · non-contrast
Comparison: None

CLINICAL DATA: Dizziness. New daily persistent headache. Dizziness,
nonspecific; headache, new or worsening.

EXAM:
MRI HEAD WITHOUT CONTRAST
TECHNIQUE: Multiplanar, multiecho pulse sequences of the brain and surrounding
structures were obtained without intravenous contrast.

[Series 2: DWI · axial · 3.0mm · 1.20mm/px · z∈[-51,+110]mm · 9 of 109 slices shown (1 of 4)]
[im 1/109]
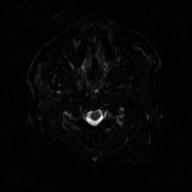
[im 14/109]
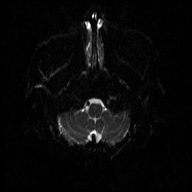
[im 28/109]
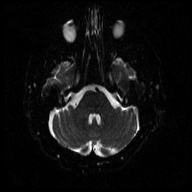
[im 41/109]
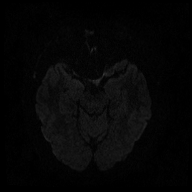
[im 55/109]
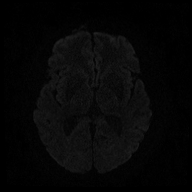
[im 68/109]
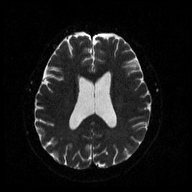
[im 82/109]
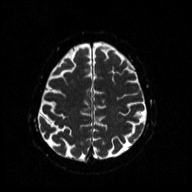
[im 95/109]
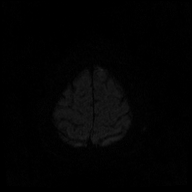
[im 109/109]
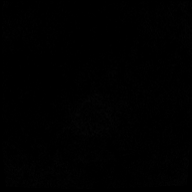

[Series 3: DWI · axial · 3.0mm · 1.20mm/px · z∈[-51,+110]mm · 4 of 55 slices shown (2 of 4)]
[im 1/55]
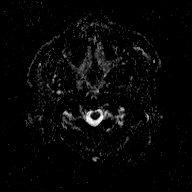
[im 19/55]
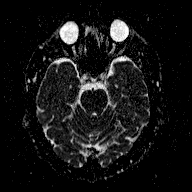
[im 37/55]
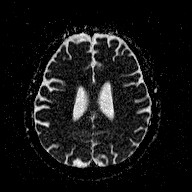
[im 55/55]
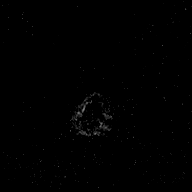

[Series 4: DWI · coronal · 3.0mm · 1.15mm/px · 7 of 94 slices shown (3 of 4)]
[im 1/94]
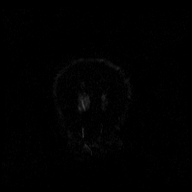
[im 16/94]
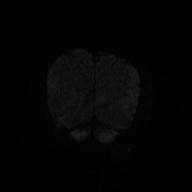
[im 32/94]
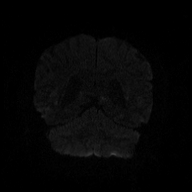
[im 47/94]
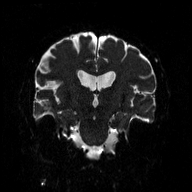
[im 63/94]
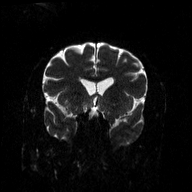
[im 78/94]
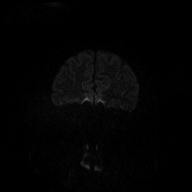
[im 94/94]
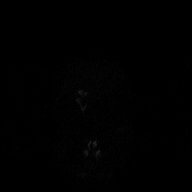

[Series 5: DWI · coronal · 3.0mm · 1.15mm/px · 4 of 47 slices shown (4 of 4)]
[im 1/47]
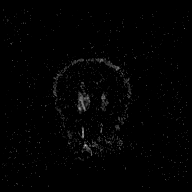
[im 16/47]
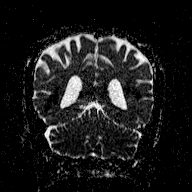
[im 31/47]
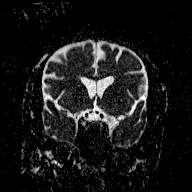
[im 47/47]
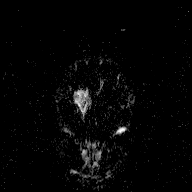

[Series 7: T2 · axial · 5.0mm · 0.72mm/px · z∈[-55,+99]mm · 2 of 23 slices shown (1 of 3)]
[im 1/23]
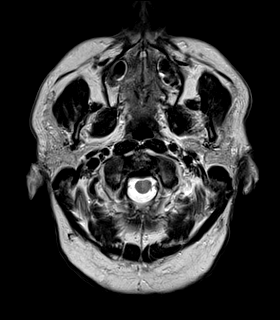
[im 23/23]
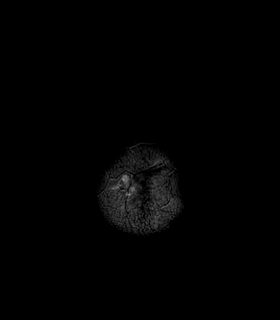

[Series 8: FLAIR · axial · 3.0mm · 0.45mm/px · z∈[-59,+103]mm · 4 of 55 slices shown]
[im 1/55]
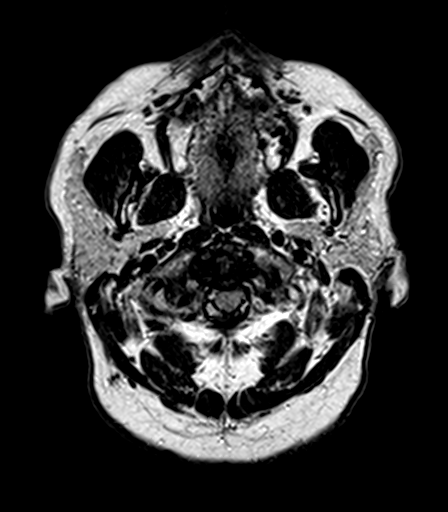
[im 19/55]
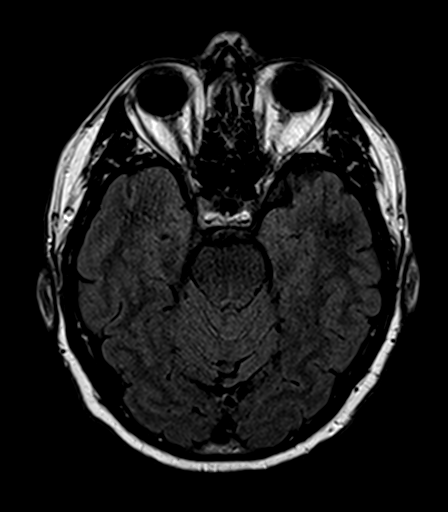
[im 37/55]
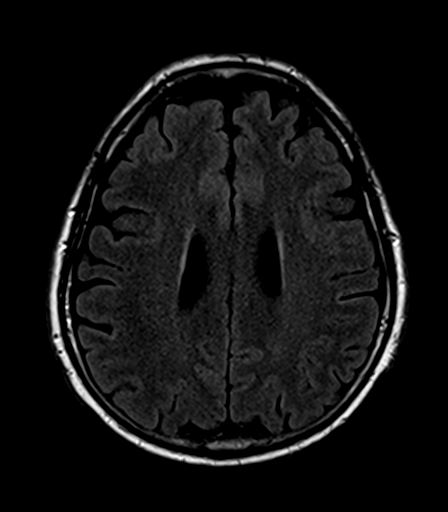
[im 55/55]
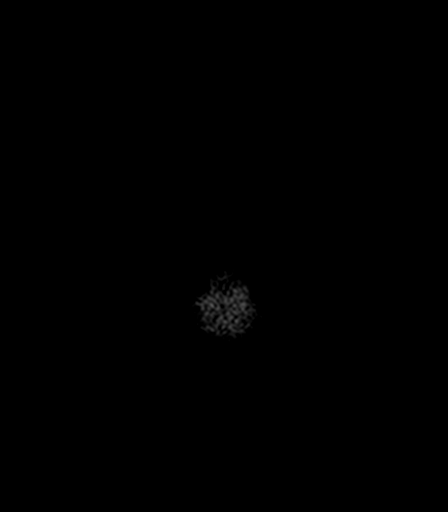

[Series 9: T2 · axial · 5.0mm · 0.72mm/px · z∈[-55,+99]mm · 2 of 23 slices shown (2 of 3)]
[im 1/23]
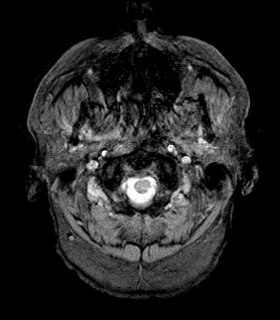
[im 23/23]
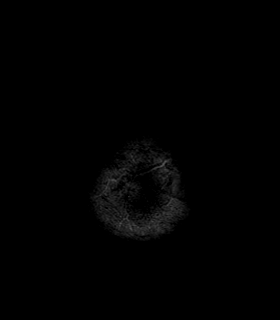

[Series 10: T1 · axial · 1.0mm · 0.94mm/px · z∈[-57,+102]mm · 12 of 160 slices shown (1 of 2)]
[im 1/160]
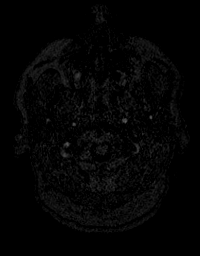
[im 15/160]
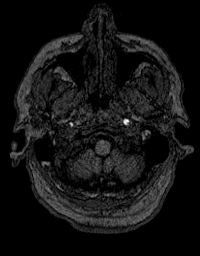
[im 29/160]
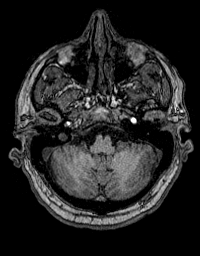
[im 44/160]
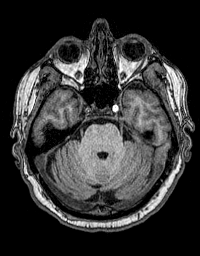
[im 58/160]
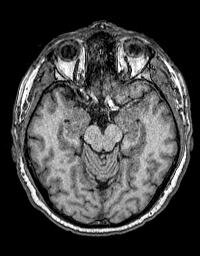
[im 73/160]
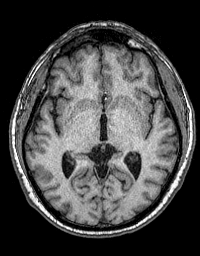
[im 87/160]
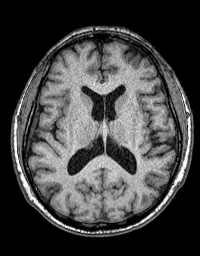
[im 102/160]
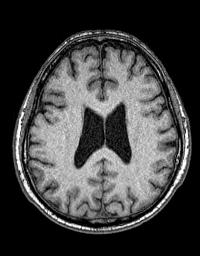
[im 116/160]
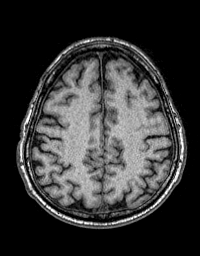
[im 131/160]
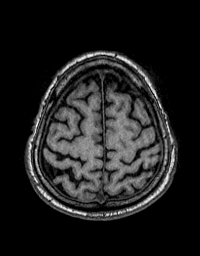
[im 145/160]
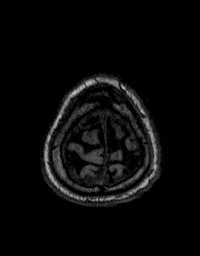
[im 160/160]
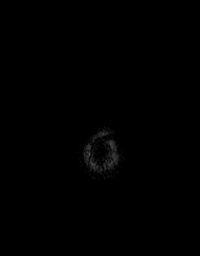

[Series 11: T2 · coronal · 5.0mm · 0.43mm/px · 2 of 29 slices shown (3 of 3)]
[im 1/29]
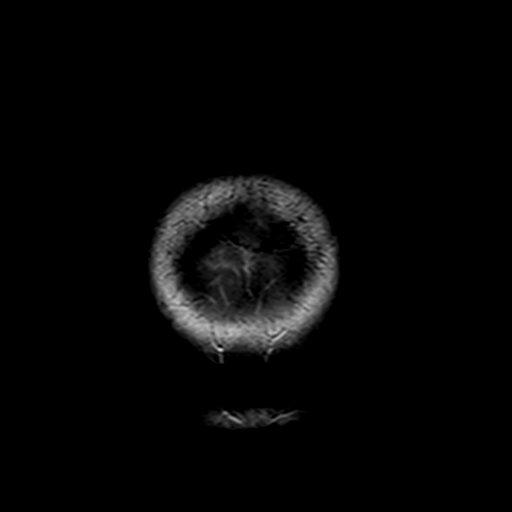
[im 29/29]
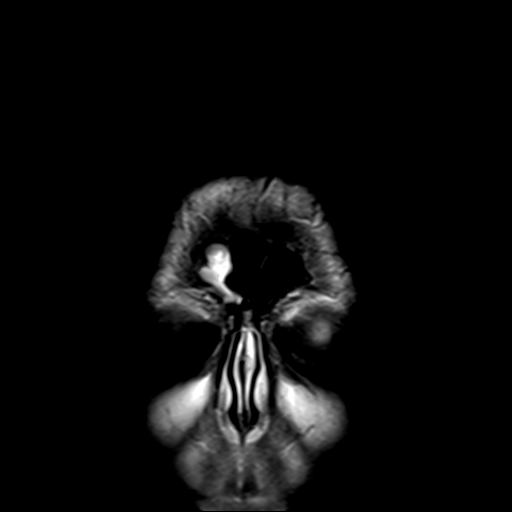

[Series 12: T1 · sagittal · 5.0mm · 0.45mm/px · 2 of 25 slices shown (2 of 2)]
[im 1/25]
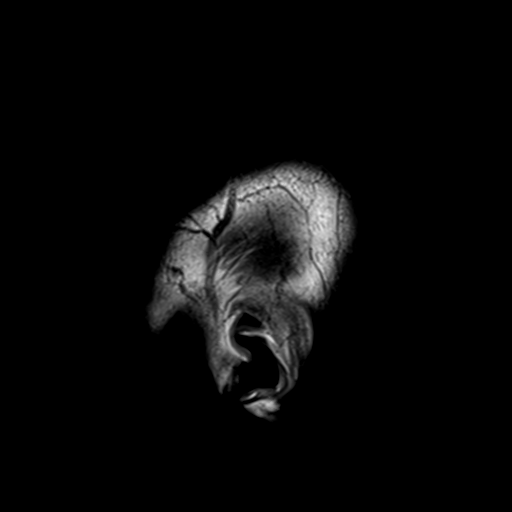
[im 25/25]
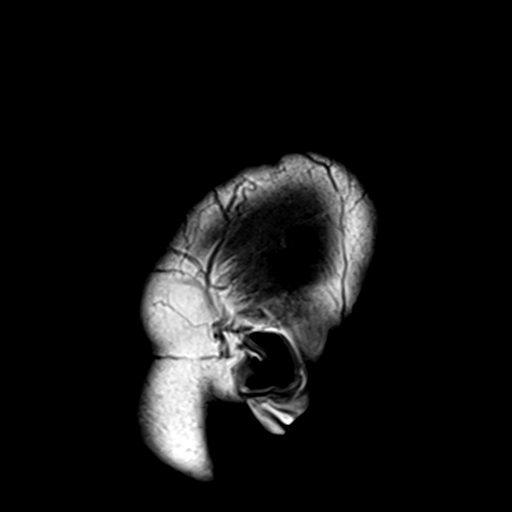

[48 of 48 positions shown; findings below may reference images not displayed]

FINDINGS: Brain:

Mild intermittent motion degradation.

Cerebral volume is normal.

No cortical encephalomalacia is identified. No significant cerebral
white matter disease.

There is no acute infarct.

No evidence of an intracranial mass.

No chronic intracranial blood products.

No extra-axial fluid collection.

No midline shift.

Partially empty sella turcica.

Vascular: Maintained flow voids within the proximal large arterial
vessels.

Skull and upper cervical spine: No focal suspicious marrow lesion.

Sinuses/Orbits: Visualized orbits show no acute finding. Extensive
T2 hyperintense opacification of the right frontal sinus and of the
anterior right ethmoid air cells.
IMPRESSION: Mildly motion degraded exam.

No evidence of acute intracranial abnormality.

Partially empty sella turcica, a nonspecific finding.

Otherwise unremarkable non-contrast MRI appearance of the brain.

Severe right frontal and right anterior ethmoid sinusitis.

## 2021-03-01 ENCOUNTER — Encounter: Payer: Self-pay | Admitting: Family Medicine

## 2021-03-01 MED ORDER — AMOXICILLIN-POT CLAVULANATE 875-125 MG PO TABS: 1.0000 | ORAL_TABLET | Freq: Two times a day (BID) | ORAL | 0 refills | Status: DC

## 2021-03-01 MED ORDER — PREDNISONE 10 MG (48) PO TBPK
ORAL_TABLET | ORAL | 0 refills | Status: DC
Start: 2021-03-01 — End: 2021-09-12

## 2021-03-11 ENCOUNTER — Other Ambulatory Visit: Payer: Self-pay | Admitting: Family Medicine

## 2021-04-20 ENCOUNTER — Other Ambulatory Visit: Payer: Self-pay | Admitting: Family Medicine

## 2021-04-21 MED ORDER — HYDROCODONE-ACETAMINOPHEN 5-325 MG PO TABS: 1.0000 | ORAL_TABLET | Freq: Four times a day (QID) | ORAL | 0 refills | Status: DC | PRN

## 2021-05-01 ENCOUNTER — Other Ambulatory Visit: Payer: Self-pay | Admitting: Family Medicine

## 2021-05-01 DIAGNOSIS — G8929 Other chronic pain: Secondary | ICD-10-CM

## 2021-05-03 ENCOUNTER — Encounter: Payer: Self-pay | Admitting: Family Medicine

## 2021-05-03 ENCOUNTER — Other Ambulatory Visit: Payer: Self-pay

## 2021-05-03 ENCOUNTER — Ambulatory Visit: Payer: BC Managed Care – PPO | Admitting: Family Medicine

## 2021-05-03 VITALS — BP 143/90 | HR 59 | Ht 71.0 in | Wt 215.8 lb

## 2021-05-03 DIAGNOSIS — I10 Essential (primary) hypertension: Secondary | ICD-10-CM

## 2021-05-03 DIAGNOSIS — R42 Dizziness and giddiness: Secondary | ICD-10-CM | POA: Diagnosis not present

## 2021-05-03 DIAGNOSIS — E039 Hypothyroidism, unspecified: Secondary | ICD-10-CM

## 2021-05-03 NOTE — Assessment & Plan Note (Signed)
Blood pressure remains fairly well controlled.  Has not taken medication over the past couple days.  He will continue to monitor readings at home.

## 2021-05-03 NOTE — Assessment & Plan Note (Signed)
This improved significantly since treatment of sinusitis.  Gabapentin seem to be contributing as well and he will remain off of this.

## 2021-05-03 NOTE — Progress Notes (Signed)
Brian Russell - 60 y.o. male MRN [id number]  Date of birth: 10-15-1960  Subjective Chief Complaint  Patient presents with   Hypertension    HPI Brian Russell is a 60 year old male here today for follow-up visit.  States he is doing fairly well at this time.  His dizziness that he was experiencing has improved after treatment of sinusitis with Augmentin and prednisone.  He did start adding back on some of his previous medications and did have some return of the dizziness when adding gabapentin back on.  After stopping this again dizziness improved.  He has remained off of this medication.  He feels okay off medication.  He is taking meloxicam which is helpful for his neck pain and radicular symptoms.  Blood pressure remains well controlled with losartan.  No side effects noted with this.  ROS:  A comprehensive ROS was completed and negative except as noted per HPI  Allergies  Allergen Reactions   Amlodipine    Buspar [Buspirone]     Past Medical History:  Diagnosis Date   Hypertension    Low thyroid stimulating hormone (TSH) level     Past Surgical History:  Procedure Laterality Date   fractured bone     JOINT REPLACEMENT     SPINAL FUSION      Social History   Socioeconomic History   Marital status: Married    Spouse name: Not on file   Number of children: Not on file   Years of education: Not on file   Highest education level: Not on file  Occupational History   Occupation: Product manager  Tobacco Use   Smoking status: Former    Types: Cigarettes    Quit date: 05/2011    Years since quitting: 9.9   Smokeless tobacco: Never  Vaping Use   Vaping Use: Never used  Substance and Sexual Activity   Alcohol use: Yes    Alcohol/week: 2.0 standard drinks    Types: 2 Standard drinks or equivalent per week   Drug use: Never   Sexual activity: Yes    Partners: Female  Other Topics Concern   Not on file  Social History Narrative   Not on file   Social Determinants of  Health   Financial Resource Strain: Not on file  Food Insecurity: Not on file  Transportation Needs: Not on file  Physical Activity: Not on file  Stress: Not on file  Social Connections: Not on file    Family History  Problem Relation Age of Onset   Hypertension Other    Heart attack Other    Stroke Other    Alzheimer's disease Mother    Stroke Father     Health Maintenance  Topic Date Due   HIV Screening  Never done   Hepatitis C Screening  Never done   Zoster Vaccines- Shingrix (1 of 2) Never done   Pneumococcal Vaccine 28-55 Years old (2 - PPSV23 if available, else PCV20) 04/21/2015   COVID-19 Vaccine (3 - Booster for [place] series) 10/01/2019   TETANUS/TDAP  07/24/2021   COLONOSCOPY (Pts 45-108yrs Insurance coverage will need to be confirmed)  12/15/2027   INFLUENZA VACCINE  Completed   HPV VACCINES  Aged Out     ----------------------------------------------------------------------------------------------------------------------------------------------------------------------------------------------------------------- Physical Exam BP (!) 143/90 (BP Location: Left Arm, Patient Position: Sitting, Cuff Size: Large)    Pulse (!) 59    Ht 5\' 11"  (1.803 m)    Wt 215 lb 12.8 oz (97.9 kg)    SpO2 98%  BMI 30.10 kg/m   Physical Exam Constitutional:      Appearance: Normal appearance.  HENT:     Head: Normocephalic and atraumatic.  Eyes:     General: No scleral icterus. Cardiovascular:     Rate and Rhythm: Normal rate and regular rhythm.  Pulmonary:     Effort: Pulmonary effort is normal.     Breath sounds: Normal breath sounds.  Musculoskeletal:     Cervical back: Neck supple.  Neurological:     General: No focal deficit present.     Mental Status: He is alert.  Psychiatric:        Mood and Affect: Mood normal.        Behavior: Behavior normal.     ------------------------------------------------------------------------------------------------------------------------------------------------------------------------------------------------------------------- Assessment and Plan  Essential hypertension Blood pressure remains fairly well controlled.  Has not taken medication over the past couple days.  He will continue to monitor readings at home.  Vertigo This improved significantly since treatment of sinusitis.  Gabapentin seem to be contributing as well and he will remain off of this.  Hypothyroidism Doing well on current dose of levothyroxine.  Update TSH today.   No orders of the defined types were placed in this encounter.   Return in about 6 months (around 11/01/2021) for HTN/Thyroid.    This visit occurred during the SARS-CoV-2 public health emergency.  Safety protocols were in place, including screening questions prior to the visit, additional usage of staff PPE, and extensive cleaning of exam room while observing appropriate contact time as indicated for disinfecting solutions.

## 2021-05-03 NOTE — Assessment & Plan Note (Signed)
Doing well on current dose of levothyroxine.  Update TSH today.

## 2021-05-04 LAB — CBC WITH DIFFERENTIAL/PLATELET
Absolute Monocytes: 610 {cells}/uL (ref 200–950)
Basophils Absolute: 67 {cells}/uL (ref 0–200)
Basophils Relative: 1 %
Eosinophils Absolute: 188 {cells}/uL (ref 15–500)
Eosinophils Relative: 2.8 %
HCT: 42.2 % (ref 38.5–50.0)
Hemoglobin: 14.4 g/dL (ref 13.2–17.1)
Lymphs Abs: 1936 {cells}/uL (ref 850–3900)
MCH: 30.6 pg (ref 27.0–33.0)
MCHC: 34.1 g/dL (ref 32.0–36.0)
MCV: 89.6 fL (ref 80.0–100.0)
MPV: 10.5 fL (ref 7.5–12.5)
Monocytes Relative: 9.1 %
Neutro Abs: 3899 {cells}/uL (ref 1500–7800)
Neutrophils Relative %: 58.2 %
Platelets: 172 Thousand/uL (ref 140–400)
RBC: 4.71 Million/uL (ref 4.20–5.80)
RDW: 12.6 % (ref 11.0–15.0)
Total Lymphocyte: 28.9 %
WBC: 6.7 Thousand/uL (ref 3.8–10.8)

## 2021-05-04 LAB — TSH: TSH: 0.2 mIU/L — ABNORMAL LOW (ref 0.40–4.50)

## 2021-05-04 LAB — BASIC METABOLIC PANEL
BUN: 15 mg/dL (ref 7–25)
CO2: 29 mmol/L (ref 20–32)
Calcium: 9.2 mg/dL (ref 8.6–10.3)
Chloride: 103 mmol/L (ref 98–110)
Creat: 0.95 mg/dL (ref 0.70–1.35)
Glucose, Bld: 80 mg/dL (ref 65–99)
Potassium: 3.7 mmol/L (ref 3.5–5.3)
Sodium: 140 mmol/L (ref 135–146)

## 2021-05-05 ENCOUNTER — Other Ambulatory Visit: Payer: Self-pay | Admitting: Family Medicine

## 2021-05-05 ENCOUNTER — Encounter: Payer: Self-pay | Admitting: Family Medicine

## 2021-05-05 DIAGNOSIS — E039 Hypothyroidism, unspecified: Secondary | ICD-10-CM

## 2021-05-05 MED ORDER — LEVOTHYROXINE SODIUM 112 MCG PO TABS: 112.0000 ug | ORAL_TABLET | Freq: Every day | ORAL | 3 refills | Status: DC

## 2021-06-25 ENCOUNTER — Other Ambulatory Visit: Payer: Self-pay | Admitting: Family Medicine

## 2021-06-25 DIAGNOSIS — G8929 Other chronic pain: Secondary | ICD-10-CM

## 2021-06-25 DIAGNOSIS — M545 Low back pain, unspecified: Secondary | ICD-10-CM

## 2021-07-29 ENCOUNTER — Other Ambulatory Visit: Payer: Self-pay | Admitting: Family Medicine

## 2021-07-29 MED ORDER — HYDROCODONE-ACETAMINOPHEN 5-325 MG PO TABS: 1.0000 | ORAL_TABLET | Freq: Four times a day (QID) | ORAL | 0 refills | Status: DC | PRN

## 2021-09-02 ENCOUNTER — Other Ambulatory Visit: Payer: Self-pay | Admitting: Family Medicine

## 2021-09-06 ENCOUNTER — Ambulatory Visit: Payer: BC Managed Care – PPO | Admitting: Family Medicine

## 2021-09-12 ENCOUNTER — Encounter: Payer: Self-pay | Admitting: Family Medicine

## 2021-09-12 ENCOUNTER — Ambulatory Visit: Payer: BC Managed Care – PPO | Admitting: Family Medicine

## 2021-09-12 VITALS — BP 133/87 | HR 61 | Ht 71.0 in | Wt 213.0 lb

## 2021-09-12 DIAGNOSIS — I10 Essential (primary) hypertension: Secondary | ICD-10-CM

## 2021-09-12 DIAGNOSIS — G4700 Insomnia, unspecified: Secondary | ICD-10-CM | POA: Diagnosis not present

## 2021-09-12 DIAGNOSIS — E039 Hypothyroidism, unspecified: Secondary | ICD-10-CM

## 2021-09-12 DIAGNOSIS — R5383 Other fatigue: Secondary | ICD-10-CM | POA: Diagnosis not present

## 2021-09-12 MED ORDER — DOXEPIN HCL 3 MG PO TABS: 3.0000 mg | ORAL_TABLET | Freq: Every day | ORAL | 0 refills | Status: DC

## 2021-09-12 NOTE — Assessment & Plan Note (Signed)
Has trouble with maintaining sleep, will try low dose doxepin.   ?

## 2021-09-12 NOTE — Progress Notes (Signed)
?Brian Russell - 61 y.o. male MRN [id number]  Date of birth: 1960/08/03 ? ?Subjective ?Chief Complaint  ?Patient presents with  ? Hypertension  ? Fatigue  ? ? ?HPI ?Brian Russell is a 61 y.o. male here today for follow up visit.  He has concerns today about elevated blood pressure. Additionally he has felt more fatigued and feels like his concentration is poor.  Has had difficulty with staying asleep, feels like his mind racing when he wakes up.  BP is better today.  Denies symptoms related to HTN including chest pain, shortness of breath, palpitations, headache or vision changes.  TSH suppressed when checked in December, did not have repeat labs.   ? ?ROS:  A comprehensive ROS was completed and negative except as noted per HPI ? ?Allergies  ?Allergen Reactions  ? Amlodipine   ? Buspar [Buspirone]   ? ? ?Past Medical History:  ?Diagnosis Date  ? Hypertension   ? Low thyroid stimulating hormone (TSH) level   ? ? ?Past Surgical History:  ?Procedure Laterality Date  ? fractured bone    ? JOINT REPLACEMENT    ? SPINAL FUSION    ? ? ?Social History  ? ?Socioeconomic History  ? Marital status: Married  ?  Spouse name: Not on file  ? Number of children: Not on file  ? Years of education: Not on file  ? Highest education level: Not on file  ?Occupational History  ? Occupation: Product manager  ?Tobacco Use  ? Smoking status: Former  ?  Types: Cigarettes  ?  Quit date: 05/2011  ?  Years since quitting: 10.3  ? Smokeless tobacco: Never  ?Vaping Use  ? Vaping Use: Never used  ?Substance and Sexual Activity  ? Alcohol use: Yes  ?  Alcohol/week: 2.0 standard drinks  ?  Types: 2 Standard drinks or equivalent per week  ? Drug use: Never  ? Sexual activity: Yes  ?  Partners: Female  ?Other Topics Concern  ? Not on file  ?Social History Narrative  ? Not on file  ? ?Social Determinants of Health  ? ?Financial Resource Strain: Not on file  ?Food Insecurity: Not on file  ?Transportation Needs: Not on file  ?Physical Activity:  Not on file  ?Stress: Not on file  ?Social Connections: Not on file  ? ? ?Family History  ?Problem Relation Age of Onset  ? Hypertension Other   ? Heart attack Other   ? Stroke Other   ? Alzheimer's disease Mother   ? Stroke Father   ? ? ?Health Maintenance  ?Topic Date Due  ? HIV Screening  Never done  ? Hepatitis C Screening  Never done  ? Zoster Vaccines- Shingrix (1 of 2) Never done  ? COVID-19 Vaccine (3 - Booster for [employer] series) 10/01/2019  ? TETANUS/TDAP  07/24/2021  ? INFLUENZA VACCINE  12/13/2021  ? COLONOSCOPY (Pts 45-32yr Insurance coverage will need to be confirmed)  12/15/2027  ? HPV VACCINES  Aged Out  ? ? ? ?----------------------------------------------------------------------------------------------------------------------------------------------------------------------------------------------------------------- ?Physical Exam ?BP 133/87 (BP Location: Left Arm, Patient Position: Sitting, Cuff Size: Large)   Pulse 61   Ht '5\' 11"'$  (1.803 m)   Wt 213 lb (96.6 kg)   SpO2 96%   BMI 29.71 kg/m?  ? ?Physical Exam ?Constitutional:   ?   Appearance: Normal appearance.  ?Eyes:  ?   General: No scleral icterus. ?Cardiovascular:  ?   Rate and Rhythm: Normal rate and regular rhythm.  ?Pulmonary:  ?  Effort: Pulmonary effort is normal.  ?   Breath sounds: Normal breath sounds.  ?Musculoskeletal:  ?   Cervical back: Neck supple.  ?Neurological:  ?   Mental Status: He is alert.  ?Psychiatric:     ?   Mood and Affect: Mood normal.     ?   Behavior: Behavior normal.  ? ? ?------------------------------------------------------------------------------------------------------------------------------------------------------------------------------------------------------------------- ?Assessment and Plan ? ?Essential hypertension ?BP is looking ok today.  Has had some elevated readings at home.  Continue current medications and monitoring at home.   ? ?Hypothyroidism ?TSH suppressed previously, rechecking TSH  today.  ? ?Insomnia ?Has trouble with maintaining sleep, will try low dose doxepin.   ? ? ?Meds ordered this encounter  ?Medications  ? Doxepin HCl 3 MG TABS  ?  Sig: Take 1 tablet (3 mg total) by mouth at bedtime.  ?  Dispense:  30 tablet  ?  Refill:  0  ? ? ?No follow-ups on file. ? ? ? ?This visit occurred during the SARS-CoV-2 public health emergency.  Safety protocols were in place, including screening questions prior to the visit, additional usage of staff PPE, and extensive cleaning of exam room while observing appropriate contact time as indicated for disinfecting solutions.  ? ?

## 2021-09-12 NOTE — Addendum Note (Signed)
Addended by: Peggye Ley on: 09/12/2021 02:55 PM ? ? Modules accepted: Orders ? ?

## 2021-09-12 NOTE — Assessment & Plan Note (Signed)
BP is looking ok today.  Has had some elevated readings at home.  Continue current medications and monitoring at home.   ?

## 2021-09-12 NOTE — Assessment & Plan Note (Signed)
TSH suppressed previously, rechecking TSH today.  ?

## 2021-09-13 ENCOUNTER — Encounter: Payer: Self-pay | Admitting: Family Medicine

## 2021-09-13 ENCOUNTER — Other Ambulatory Visit: Payer: Self-pay | Admitting: Family Medicine

## 2021-09-13 ENCOUNTER — Telehealth: Payer: Self-pay

## 2021-09-13 DIAGNOSIS — E039 Hypothyroidism, unspecified: Secondary | ICD-10-CM

## 2021-09-13 LAB — CBC WITH DIFFERENTIAL/PLATELET
Absolute Monocytes: 461 cells/uL (ref 200–950)
Basophils Absolute: 51 cells/uL (ref 0–200)
Basophils Relative: 0.8 %
Eosinophils Absolute: 122 cells/uL (ref 15–500)
Eosinophils Relative: 1.9 %
HCT: 43.6 % (ref 38.5–50.0)
Hemoglobin: 14.9 g/dL (ref 13.2–17.1)
Lymphs Abs: 1677 cells/uL (ref 850–3900)
MCH: 31.1 pg (ref 27.0–33.0)
MCHC: 34.2 g/dL (ref 32.0–36.0)
MCV: 91 fL (ref 80.0–100.0)
MPV: 11 fL (ref 7.5–12.5)
Monocytes Relative: 7.2 %
Neutro Abs: 4090 cells/uL (ref 1500–7800)
Neutrophils Relative %: 63.9 %
Platelets: 179 10*3/uL (ref 140–400)
RBC: 4.79 10*6/uL (ref 4.20–5.80)
RDW: 13.2 % (ref 11.0–15.0)
Total Lymphocyte: 26.2 %
WBC: 6.4 10*3/uL (ref 3.8–10.8)

## 2021-09-13 LAB — COMPLETE METABOLIC PANEL WITH GFR
AG Ratio: 2.1 (calc) (ref 1.0–2.5)
ALT: 17 U/L (ref 9–46)
AST: 23 U/L (ref 10–35)
Albumin: 4.4 g/dL (ref 3.6–5.1)
Alkaline phosphatase (APISO): 43 U/L (ref 35–144)
BUN: 17 mg/dL (ref 7–25)
CO2: 31 mmol/L (ref 20–32)
Calcium: 9.3 mg/dL (ref 8.6–10.3)
Chloride: 102 mmol/L (ref 98–110)
Creat: 0.92 mg/dL (ref 0.70–1.35)
Globulin: 2.1 g/dL (calc) (ref 1.9–3.7)
Glucose, Bld: 89 mg/dL (ref 65–99)
Potassium: 3.9 mmol/L (ref 3.5–5.3)
Sodium: 142 mmol/L (ref 135–146)
Total Bilirubin: 0.7 mg/dL (ref 0.2–1.2)
Total Protein: 6.5 g/dL (ref 6.1–8.1)
eGFR: 94 mL/min/{1.73_m2} (ref >=60)

## 2021-09-13 LAB — T4, FREE: Free T4: 1.5 ng/dL (ref 0.8–1.8)

## 2021-09-13 LAB — VITAMIN B12: Vitamin B-12: 253 pg/mL (ref 200–1100)

## 2021-09-13 LAB — TSH: TSH: 4.56 mIU/L — ABNORMAL HIGH (ref 0.40–4.50)

## 2021-09-13 MED ORDER — LEVOTHYROXINE SODIUM 125 MCG PO TABS: ORAL_TABLET | ORAL | 0 refills | Status: DC

## 2021-09-13 NOTE — Telephone Encounter (Signed)
Initiated Prior authorization [id number] HCl '3MG'$  tablets ?Via: Covermymeds ?Case/Key: [id number] ?Status: approved  as of 09/13/21 ?Reason:Coverage Starts on: 09/13/2021 12:00:00 AM, Coverage Ends on: 09/13/2022 12:00:00 AM. ?Notified Pt via: Mychart  ?

## 2021-10-28 ENCOUNTER — Other Ambulatory Visit: Payer: Self-pay | Admitting: Family Medicine

## 2021-10-28 DIAGNOSIS — G8929 Other chronic pain: Secondary | ICD-10-CM

## 2021-10-28 DIAGNOSIS — M545 Low back pain, unspecified: Secondary | ICD-10-CM

## 2021-10-29 ENCOUNTER — Other Ambulatory Visit: Payer: Self-pay | Admitting: Family Medicine

## 2021-11-01 ENCOUNTER — Ambulatory Visit: Payer: BC Managed Care – PPO | Admitting: Family Medicine

## 2021-11-01 ENCOUNTER — Encounter: Payer: Self-pay | Admitting: Family Medicine

## 2021-11-01 VITALS — BP 135/79 | HR 53 | Ht 71.0 in | Wt 208.0 lb

## 2021-11-01 DIAGNOSIS — I10 Essential (primary) hypertension: Secondary | ICD-10-CM

## 2021-11-01 DIAGNOSIS — H04202 Unspecified epiphora, left lacrimal gland: Secondary | ICD-10-CM | POA: Diagnosis not present

## 2021-11-01 DIAGNOSIS — H04209 Unspecified epiphora, unspecified lacrimal gland: Secondary | ICD-10-CM | POA: Insufficient documentation

## 2021-11-01 DIAGNOSIS — E039 Hypothyroidism, unspecified: Secondary | ICD-10-CM

## 2021-11-01 DIAGNOSIS — M48061 Spinal stenosis, lumbar region without neurogenic claudication: Secondary | ICD-10-CM | POA: Diagnosis not present

## 2021-11-01 MED ORDER — HYDROCODONE-ACETAMINOPHEN 5-325 MG PO TABS: 1.0000 | ORAL_TABLET | Freq: Four times a day (QID) | ORAL | 0 refills | Status: DC | PRN

## 2021-11-01 MED ORDER — CHLORTHALIDONE 25 MG PO TABS: 25.0000 mg | ORAL_TABLET | Freq: Every day | ORAL | 1 refills | Status: DC

## 2021-11-01 MED ORDER — LOSARTAN POTASSIUM 100 MG PO TABS: 100.0000 mg | ORAL_TABLET | Freq: Every day | ORAL | 1 refills | Status: DC

## 2021-11-01 NOTE — Assessment & Plan Note (Addendum)
Using hydrocodone as needed for back pain.  This was renewed today.

## 2021-11-01 NOTE — Assessment & Plan Note (Signed)
Does not appear to be any infection of the.  No other abnormalities noted.  Recommend that he schedule with his eye doctor.

## 2021-11-01 NOTE — Progress Notes (Signed)
Brian Russell - 61 y.o. male MRN [id number]  Date of birth: 05/12/1961  Subjective Chief Complaint  Patient presents with   Hypertension   Eye Drainage    HPI Brian Russell is a 61 year old male here today for follow-up visit.  Reports overall he is feeling well at this time.  Labs completed previously and we made adjustments to his thyroid medication.  He is due to have this rechecked.  Feels pretty good with current dose of levothyroxine.  He has noticed some drainage from his left eye.  Mild pain occasionally but no redness or swelling.  He has not seen his ophthalmologist.  He is requesting refill of his hydrocodone.  He takes this occasionally as needed for his back and neck pain.  No signs of abuse.  ROS:  A comprehensive ROS was completed and negative except as noted per HPI  Allergies  Allergen Reactions   Amlodipine    Buspar [Buspirone]     Past Medical History:  Diagnosis Date   Hypertension    Low thyroid stimulating hormone (TSH) level     Past Surgical History:  Procedure Laterality Date   fractured bone     JOINT REPLACEMENT     SPINAL FUSION      Social History   Socioeconomic History   Marital status: Married    Spouse name: Not on file   Number of children: Not on file   Years of education: Not on file   Highest education level: Not on file  Occupational History   Occupation: Product manager  Tobacco Use   Smoking status: Former    Types: Cigarettes    Quit date: 05/2011    Years since quitting: 10.4   Smokeless tobacco: Never  Vaping Use   Vaping Use: Never used  Substance and Sexual Activity   Alcohol use: Yes    Alcohol/week: 2.0 standard drinks of alcohol    Types: 2 Standard drinks or equivalent per week   Drug use: Never   Sexual activity: Yes    Partners: Female  Other Topics Concern   Not on file  Social History Narrative   Not on file   Social Determinants of Health   Financial Resource Strain: Not on file  Food Insecurity:  Not on file  Transportation Needs: Not on file  Physical Activity: Not on file  Stress: Not on file  Social Connections: Not on file    Family History  Problem Relation Age of Onset   Hypertension Other    Heart attack Other    Stroke Other    Alzheimer's disease Mother    Stroke Father     Health Maintenance  Topic Date Due   TETANUS/TDAP  02/01/2022 (Originally 07/24/2021)   COVID-19 Vaccine (3 - Pfizer series) 02/01/2022 (Originally 10/01/2019)   Zoster Vaccines- Shingrix (1 of 2) 02/01/2022 (Originally 06/18/2009)   Hepatitis C Screening  11/02/2022 (Originally 06/18/1977)   HIV Screening  11/02/2022 (Originally 06/18/1974)   INFLUENZA VACCINE  12/13/2021   COLONOSCOPY (Pts 45-64yr Insurance coverage will need to be confirmed)  12/15/2027   HPV VACCINES  Aged Out     ----------------------------------------------------------------------------------------------------------------------------------------------------------------------------------------------------------------- Physical Exam BP 135/79 (BP Location: Left Arm, Patient Position: Sitting, Cuff Size: Large)   Pulse (!) 53   Ht '5\' 11"'$  (1.803 m)   Wt 208 lb (94.3 kg)   SpO2 98%   BMI 29.01 kg/m   Physical Exam Constitutional:      Appearance: Normal appearance.  Eyes:  General: No scleral icterus.    Extraocular Movements: Extraocular movements intact.     Conjunctiva/sclera: Conjunctivae normal.     Pupils: Pupils are equal, round, and reactive to light.  Cardiovascular:     Rate and Rhythm: Normal rate and regular rhythm.  Pulmonary:     Effort: Pulmonary effort is normal.     Breath sounds: Normal breath sounds.  Musculoskeletal:     Cervical back: Neck supple.  Neurological:     Mental Status: He is alert.  Psychiatric:        Mood and Affect: Mood normal.        Behavior: Behavior normal.      ------------------------------------------------------------------------------------------------------------------------------------------------------------------------------------------------------------------- Assessment and Plan  Essential hypertension Blood pressure is fairly well controlled at this time.  Recommend continuation of current medications.  Hypothyroidism He has made adjustments to his thyroid medication.  Updated TSH today.  Lumbar foraminal stenosis Using hydrocodone as needed for back pain.  This was renewed today.  Watering of eye Does not appear to be any infection of the.  No other abnormalities noted.  Recommend that he schedule with his eye doctor.   Meds ordered this encounter  Medications   losartan (COZAAR) 100 MG tablet    Sig: Take 1 tablet (100 mg total) by mouth daily.    Dispense:  90 tablet    Refill:  1   chlorthalidone (HYGROTON) 25 MG tablet    Sig: Take 1 tablet (25 mg total) by mouth daily.    Dispense:  90 tablet    Refill:  1   HYDROcodone-acetaminophen (NORCO/VICODIN) 5-325 MG tablet    Sig: Take 1 tablet by mouth every 6 (six) hours as needed for moderate pain or severe pain.    Dispense:  30 tablet    Refill:  0    Return in about 6 months (around 05/03/2022) for HTN.    This visit occurred during the SARS-CoV-2 public health emergency.  Safety protocols were in place, including screening questions prior to the visit, additional usage of staff PPE, and extensive cleaning of exam room while observing appropriate contact time as indicated for disinfecting solutions.

## 2021-11-01 NOTE — Assessment & Plan Note (Signed)
He has made adjustments to his thyroid medication.  Updated TSH today.

## 2021-11-01 NOTE — Assessment & Plan Note (Signed)
Blood pressure is fairly well controlled at this time.  Recommend continuation of current medications.

## 2021-11-02 LAB — TSH: TSH: 8.17 mIU/L — ABNORMAL HIGH (ref 0.40–4.50)

## 2021-11-03 ENCOUNTER — Encounter: Payer: Self-pay | Admitting: Family Medicine

## 2021-11-03 DIAGNOSIS — E039 Hypothyroidism, unspecified: Secondary | ICD-10-CM

## 2021-11-17 MED ORDER — LEVOTHYROXINE SODIUM 125 MCG PO TABS
ORAL_TABLET | ORAL | 0 refills | Status: DC
Start: 2021-11-17 — End: 2021-12-23

## 2021-11-17 MED ORDER — LEVOTHYROXINE SODIUM 112 MCG PO TABS: 112.0000 ug | ORAL_TABLET | Freq: Every day | ORAL | 3 refills | Status: DC

## 2021-11-17 NOTE — Addendum Note (Signed)
Addended by: Narda Rutherford on: 11/17/2021 09:35 AM   Modules accepted: Orders

## 2021-12-06 ENCOUNTER — Other Ambulatory Visit: Payer: Self-pay | Admitting: Family Medicine

## 2021-12-23 ENCOUNTER — Other Ambulatory Visit: Payer: Self-pay | Admitting: Family Medicine

## 2022-01-03 DIAGNOSIS — E039 Hypothyroidism, unspecified: Secondary | ICD-10-CM | POA: Diagnosis not present

## 2022-01-04 ENCOUNTER — Other Ambulatory Visit: Payer: Self-pay | Admitting: Family Medicine

## 2022-01-19 ENCOUNTER — Encounter: Payer: Self-pay | Admitting: Family Medicine

## 2022-01-19 ENCOUNTER — Ambulatory Visit (INDEPENDENT_AMBULATORY_CARE_PROVIDER_SITE_OTHER): Payer: BC Managed Care – PPO | Admitting: Family Medicine

## 2022-01-19 VITALS — BP 128/86 | HR 59 | Ht 71.0 in | Wt 198.0 lb

## 2022-01-19 DIAGNOSIS — Z634 Disappearance and death of family member: Secondary | ICD-10-CM

## 2022-01-19 DIAGNOSIS — Z125 Encounter for screening for malignant neoplasm of prostate: Secondary | ICD-10-CM | POA: Diagnosis not present

## 2022-01-19 DIAGNOSIS — F4321 Adjustment disorder with depressed mood: Secondary | ICD-10-CM | POA: Insufficient documentation

## 2022-01-19 DIAGNOSIS — Z0000 Encounter for general adult medical examination without abnormal findings: Secondary | ICD-10-CM

## 2022-01-19 DIAGNOSIS — Z23 Encounter for immunization: Secondary | ICD-10-CM

## 2022-01-19 DIAGNOSIS — E039 Hypothyroidism, unspecified: Secondary | ICD-10-CM | POA: Diagnosis not present

## 2022-01-19 DIAGNOSIS — I10 Essential (primary) hypertension: Secondary | ICD-10-CM | POA: Diagnosis not present

## 2022-01-19 DIAGNOSIS — E785 Hyperlipidemia, unspecified: Secondary | ICD-10-CM

## 2022-01-19 NOTE — Patient Instructions (Signed)

## 2022-01-19 NOTE — Assessment & Plan Note (Signed)
Offered support and discussed referral for grief counseling if interested.  He declines at this time.

## 2022-01-19 NOTE — Progress Notes (Signed)
Brian Russell - 61 y.o. male MRN [id number]  Date of birth: April 03, 1961  Subjective Chief Complaint  Patient presents with   Annual Exam    HPI Brian Russell is a 61 year old male here today for annual exam.  Reports he is doing okay.  Has had difficult time recently as his 2 year old son died suddenly a little over a month ago.  He does have good support from friends and other family members.  They are still awaiting autopsy results to determine what caused this.  He has tried to stay fairly active.  Feels like diet is pretty good.  He is a non-smoker.  He consumes alcohol occasionally.  He would like to have his flu shot today.  Review of Systems  Constitutional:  Negative for chills, fever, malaise/fatigue and weight loss.  HENT:  Negative for congestion, ear pain and sore throat.   Eyes:  Negative for blurred vision, double vision and pain.  Respiratory:  Negative for cough and shortness of breath.   Cardiovascular:  Negative for chest pain and palpitations.  Gastrointestinal:  Negative for abdominal pain, blood in stool, constipation, heartburn and nausea.  Genitourinary:  Negative for dysuria and urgency.  Musculoskeletal:  Negative for joint pain and myalgias.  Neurological:  Negative for dizziness and headaches.  Endo/Heme/Allergies:  Does not bruise/bleed easily.  Psychiatric/Behavioral:  Negative for depression. The patient is not nervous/anxious and does not have insomnia.      Allergies  Allergen Reactions   Amlodipine    Buspar [Buspirone]     Past Medical History:  Diagnosis Date   Hypertension    Low thyroid stimulating hormone (TSH) level     Past Surgical History:  Procedure Laterality Date   fractured bone     JOINT REPLACEMENT     SPINAL FUSION      Social History   Socioeconomic History   Marital status: Married    Spouse name: Not on file   Number of children: Not on file   Years of education: Not on file   Highest education level: Not on  file  Occupational History   Occupation: Product manager  Tobacco Use   Smoking status: Former    Types: Cigarettes    Quit date: 05/2011    Years since quitting: 10.6   Smokeless tobacco: Never  Vaping Use   Vaping Use: Never used  Substance and Sexual Activity   Alcohol use: Yes    Alcohol/week: 2.0 standard drinks of alcohol    Types: 2 Standard drinks or equivalent per week   Drug use: Never   Sexual activity: Yes    Partners: Female  Other Topics Concern   Not on file  Social History Narrative   Not on file   Social Determinants of Health   Financial Resource Strain: Not on file  Food Insecurity: Not on file  Transportation Needs: Not on file  Physical Activity: Not on file  Stress: Not on file  Social Connections: Not on file    Family History  Problem Relation Age of Onset   Hypertension Other    Heart attack Other    Stroke Other    Alzheimer's disease Mother    Stroke Father     Health Maintenance  Topic Date Due   TETANUS/TDAP  02/01/2022 (Originally 07/24/2021)   COVID-19 Vaccine (3 - Pfizer series) 02/01/2022 (Originally 10/01/2019)   Zoster Vaccines- Shingrix (1 of 2) 02/01/2022 (Originally 06/18/2009)   Hepatitis C Screening  11/02/2022 (Originally 06/18/1977)   HIV  Screening  11/02/2022 (Originally 06/18/1974)   COLONOSCOPY (Pts 45-29yr Insurance coverage will need to be confirmed)  12/15/2027   INFLUENZA VACCINE  Completed   HPV VACCINES  Aged Out     ----------------------------------------------------------------------------------------------------------------------------------------------------------------------------------------------------------------- Physical Exam BP 128/86 (BP Location: Left Arm, Patient Position: Sitting, Cuff Size: Large)   Pulse (!) 59   Ht '5\' 11"'$  (1.803 m)   Wt 198 lb 0.6 oz (89.8 kg)   SpO2 99%   BMI 27.62 kg/m   Physical Exam Constitutional:      General: He is not in acute distress. HENT:     Head:  Normocephalic and atraumatic.     Right Ear: Tympanic membrane and external ear normal.     Left Ear: Tympanic membrane and external ear normal.  Eyes:     General: No scleral icterus. Neck:     Thyroid: No thyromegaly.  Cardiovascular:     Rate and Rhythm: Normal rate and regular rhythm.     Heart sounds: Normal heart sounds.  Pulmonary:     Effort: Pulmonary effort is normal.     Breath sounds: Normal breath sounds.  Abdominal:     General: Bowel sounds are normal. There is no distension.     Palpations: Abdomen is soft.     Tenderness: There is no abdominal tenderness. There is no guarding.  Musculoskeletal:     Cervical back: Normal range of motion.  Lymphadenopathy:     Cervical: No cervical adenopathy.  Skin:    General: Skin is warm and dry.     Findings: No rash.  Neurological:     Mental Status: He is alert and oriented to person, place, and time.     Cranial Nerves: No cranial nerve deficit.     Motor: No abnormal muscle tone.  Psychiatric:        Mood and Affect: Mood normal.        Behavior: Behavior normal.     ------------------------------------------------------------------------------------------------------------------------------------------------------------------------------------------------------------------- Assessment and Plan  Grief at loss of child Offered support and discussed referral for grief counseling if interested.  He declines at this time.  Well adult exam Well adult Orders Placed This Encounter  Procedures   Flu Vaccine QUAD 6+ mos PF IM (Fluarix Quad PF)   COMPLETE METABOLIC PANEL WITH GFR   CBC with Differential   Lipid Panel w/reflex Direct LDL   PSA   TSH  Screenings: Per lab orders Immunizations: Flu vaccine given today. Anticipatory guidance/risk factor reduction: Recommendations per AVS.   No orders of the defined types were placed in this encounter.   No follow-ups on file.    This visit occurred during the  SARS-CoV-2 public health emergency.  Safety protocols were in place, including screening questions prior to the visit, additional usage of staff PPE, and extensive cleaning of exam room while observing appropriate contact time as indicated for disinfecting solutions.

## 2022-01-19 NOTE — Assessment & Plan Note (Signed)
Well adult Orders Placed This Encounter  Procedures   Flu Vaccine QUAD 6+ mos PF IM (Fluarix Quad PF)   COMPLETE METABOLIC PANEL WITH GFR   CBC with Differential   Lipid Panel w/reflex Direct LDL   PSA   TSH  Screenings: Per lab orders Immunizations: Flu vaccine given today. Anticipatory guidance/risk factor reduction: Recommendations per AVS. 

## 2022-01-20 LAB — CBC WITH DIFFERENTIAL/PLATELET
Absolute Monocytes: 444 cells/uL (ref 200–950)
Basophils Absolute: 90 cells/uL (ref 0–200)
Basophils Relative: 1.5 %
Eosinophils Absolute: 102 cells/uL (ref 15–500)
Eosinophils Relative: 1.7 %
HCT: 43.4 % (ref 38.5–50.0)
Hemoglobin: 14.8 g/dL (ref 13.2–17.1)
Lymphs Abs: 1500 cells/uL (ref 850–3900)
MCH: 30.9 pg (ref 27.0–33.0)
MCHC: 34.1 g/dL (ref 32.0–36.0)
MCV: 90.6 fL (ref 80.0–100.0)
MPV: 10.5 fL (ref 7.5–12.5)
Monocytes Relative: 7.4 %
Neutro Abs: 3864 cells/uL (ref 1500–7800)
Neutrophils Relative %: 64.4 %
Platelets: 160 10*3/uL (ref 140–400)
RBC: 4.79 10*6/uL (ref 4.20–5.80)
RDW: 13 % (ref 11.0–15.0)
Total Lymphocyte: 25 %
WBC: 6 10*3/uL (ref 3.8–10.8)

## 2022-01-20 LAB — PSA: PSA: 0.54 ng/mL (ref <=4.00)

## 2022-01-20 LAB — LIPID PANEL W/REFLEX DIRECT LDL
Cholesterol: 191 mg/dL (ref <200)
HDL: 48 mg/dL (ref >=40)
LDL Cholesterol (Calc): 124 mg/dL (calc) — ABNORMAL HIGH
Non-HDL Cholesterol (Calc): 143 mg/dL (calc) — ABNORMAL HIGH (ref <130)
Total CHOL/HDL Ratio: 4 (calc) (ref <5.0)
Triglycerides: 88 mg/dL (ref <150)

## 2022-01-20 LAB — COMPLETE METABOLIC PANEL WITH GFR
AG Ratio: 2.5 (calc) (ref 1.0–2.5)
ALT: 11 U/L (ref 9–46)
AST: 14 U/L (ref 10–35)
Albumin: 4.5 g/dL (ref 3.6–5.1)
Alkaline phosphatase (APISO): 44 U/L (ref 35–144)
BUN: 15 mg/dL (ref 7–25)
CO2: 30 mmol/L (ref 20–32)
Calcium: 9.5 mg/dL (ref 8.6–10.3)
Chloride: 100 mmol/L (ref 98–110)
Creat: 0.99 mg/dL (ref 0.70–1.35)
Globulin: 1.8 g/dL (calc) — ABNORMAL LOW (ref 1.9–3.7)
Glucose, Bld: 87 mg/dL (ref 65–99)
Potassium: 3.7 mmol/L (ref 3.5–5.3)
Sodium: 140 mmol/L (ref 135–146)
Total Bilirubin: 0.8 mg/dL (ref 0.2–1.2)
Total Protein: 6.3 g/dL (ref 6.1–8.1)
eGFR: 86 mL/min/{1.73_m2} (ref >=60)

## 2022-01-20 LAB — TSH: TSH: 1.17 mIU/L (ref 0.40–4.50)

## 2022-01-27 ENCOUNTER — Encounter: Payer: Self-pay | Admitting: Family Medicine

## 2022-02-26 ENCOUNTER — Other Ambulatory Visit: Payer: Self-pay | Admitting: Family Medicine

## 2022-02-26 DIAGNOSIS — M545 Low back pain, unspecified: Secondary | ICD-10-CM

## 2022-03-08 ENCOUNTER — Other Ambulatory Visit: Payer: Self-pay | Admitting: Family Medicine

## 2022-03-09 MED ORDER — HYDROCODONE-ACETAMINOPHEN 5-325 MG PO TABS: 1.0000 | ORAL_TABLET | Freq: Four times a day (QID) | ORAL | 0 refills | Status: DC | PRN

## 2022-03-10 ENCOUNTER — Telehealth: Payer: Self-pay

## 2022-03-10 NOTE — Telephone Encounter (Signed)
From: [name], DO Sent: 03/10/2022   1:27 PM EDT To: Narda Rutherford, CMA Subject: FW: Appointment Request                        Needs tdap along with shingles as our last documented was in 2013.  Ok to give these on nurse

## 2022-03-15 ENCOUNTER — Encounter: Payer: Self-pay | Admitting: Family Medicine

## 2022-03-15 ENCOUNTER — Ambulatory Visit (INDEPENDENT_AMBULATORY_CARE_PROVIDER_SITE_OTHER): Payer: BC Managed Care – PPO | Admitting: Family Medicine

## 2022-03-15 VITALS — Temp 98.6°F

## 2022-03-15 DIAGNOSIS — Z23 Encounter for immunization: Secondary | ICD-10-CM

## 2022-03-15 NOTE — Progress Notes (Signed)
Patient here today for tetanus and shingrix vaccines.  Patient advised to schedule 2nd shingrix vaccine in 2 - 6 months.

## 2022-04-10 ENCOUNTER — Other Ambulatory Visit: Payer: Self-pay | Admitting: Family Medicine

## 2022-04-11 ENCOUNTER — Telehealth (INDEPENDENT_AMBULATORY_CARE_PROVIDER_SITE_OTHER): Payer: BC Managed Care – PPO | Admitting: Family Medicine

## 2022-04-11 ENCOUNTER — Encounter: Payer: Self-pay | Admitting: Family Medicine

## 2022-04-11 VITALS — Ht 71.0 in | Wt 198.0 lb

## 2022-04-11 DIAGNOSIS — J0100 Acute maxillary sinusitis, unspecified: Secondary | ICD-10-CM

## 2022-04-11 DIAGNOSIS — J019 Acute sinusitis, unspecified: Secondary | ICD-10-CM | POA: Insufficient documentation

## 2022-04-11 MED ORDER — PREDNISONE 50 MG PO TABS: ORAL_TABLET | ORAL | 0 refills | Status: DC

## 2022-04-11 MED ORDER — DOXYCYCLINE HYCLATE 100 MG PO TABS: 100.0000 mg | ORAL_TABLET | Freq: Two times a day (BID) | ORAL | 0 refills | Status: DC

## 2022-04-11 NOTE — Assessment & Plan Note (Addendum)
Start doxycycline and prednisone.  Recommend increased fluids.  Recommend humidifier in the bedroom and nasal saline rinses.  Contact clinic for in person visit if not improving.

## 2022-04-11 NOTE — Progress Notes (Signed)
Started 2 weeks prior. The worst has subsided.  Symptoms: headache, sinuses feels like a fist in the head, pressure  Meds: Dayquil

## 2022-04-11 NOTE — Progress Notes (Signed)
Brian Russell - 61 y.o. male MRN [id number]  Date of birth: 06/15/1960   This visit type was conducted due to national recommendations for restrictions regarding the COVID-19 Pandemic (e.g. social distancing).  This format is felt to be most appropriate for this patient at this time.  All issues noted in this document were discussed and addressed.  No physical exam was performed (except for noted visual exam findings with Video Visits).  I discussed the limitations of evaluation and management by telemedicine and the availability of in person appointments. The patient expressed understanding and agreed to proceed.  I connected withNAME@ on 04/11/22 at 10:30 AM EST by a video enabled telemedicine application and verified that I am speaking with the correct person using two identifiers.  Present at visit: [name], DO Myrtie Soman   Patient Location: Work [place] [address]   Provider location:   [place]  Chief Complaint  Patient presents with   Sinusitis    HPI  Brian Russell is a 61 y.o. male who presents via audio/video conferencing for a telehealth visit today.  He reports sinus pain and pressure, chest congestion, headache.  Denies fever, chills, nausea, vomiting, diarrhea, chest pain, wheezing or dyspnea.  He has tried OTC dayquil.   ROS:  A comprehensive ROS was completed and negative except as noted per HPI  Past Medical History:  Diagnosis Date   Hypertension    Low thyroid stimulating hormone (TSH) level     Past Surgical History:  Procedure Laterality Date   fractured bone     JOINT REPLACEMENT     SPINAL FUSION      Family History  Problem Relation Age of Onset   Hypertension Other    Heart attack Other    Stroke Other    Alzheimer's disease Mother    Stroke Father     Social History   Socioeconomic History   Marital status: Married    Spouse name: Not on file   Number of children: Not on file   Years of  education: Not on file   Highest education level: Not on file  Occupational History   Occupation: Product manager  Tobacco Use   Smoking status: Former    Types: Cigarettes    Quit date: 05/2011    Years since quitting: 10.9   Smokeless tobacco: Never  Vaping Use   Vaping Use: Never used  Substance and Sexual Activity   Alcohol use: Yes    Alcohol/week: 2.0 standard drinks of alcohol    Types: 2 Standard drinks or equivalent per week   Drug use: Never   Sexual activity: Yes    Partners: Female  Other Topics Concern   Not on file  Social History Narrative   Not on file   Social Determinants of Health   Financial Resource Strain: Not on file  Food Insecurity: Not on file  Transportation Needs: Not on file  Physical Activity: Not on file  Stress: Not on file  Social Connections: Not on file  Intimate Partner Violence: Not on file     Current Outpatient Medications:    aspirin EC 81 MG tablet, Take 81 mg by mouth daily., Disp: , Rfl:    buPROPion (WELLBUTRIN XL) 150 MG 24 hr tablet, TAKE ONE TABLET BY MOUTH DAILY, Disp: 90 tablet, Rfl: 2   chlorthalidone (HYGROTON) 25 MG tablet, Take 1 tablet (25 mg total) by mouth daily., Disp: 90 tablet, Rfl: 1   Doxepin HCl  3 MG TABS, Take 1 tablet (3 mg total) by mouth at bedtime., Disp: 30 tablet, Rfl: 0   doxycycline (VIBRA-TABS) 100 MG tablet, Take 1 tablet (100 mg total) by mouth 2 (two) times daily., Disp: 20 tablet, Rfl: 0   HYDROcodone-acetaminophen (NORCO/VICODIN) 5-325 MG tablet, Take 1 tablet by mouth every 6 (six) hours as needed for moderate pain or severe pain., Disp: 30 tablet, Rfl: 0   levothyroxine (SYNTHROID) 125 MCG tablet, TAKE ONE TABLET BY MOUTH ONCE WEEKLY ON SUNDAY, Disp: 12 tablet, Rfl: 0   losartan (COZAAR) 100 MG tablet, Take 1 tablet (100 mg total) by mouth daily., Disp: 90 tablet, Rfl: 1   meclizine (ANTIVERT) 25 MG tablet, Take 1 tablet (25 mg total) by mouth 3 (three) times daily as needed for dizziness.,  Disp: 30 tablet, Rfl: 0   meloxicam (MOBIC) 15 MG tablet, TAKE 1/2 TABLET BY MOUTH TWICE A DAY, Disp: 60 tablet, Rfl: 1   omeprazole (PRILOSEC) 40 MG capsule, TAKE ONE CAPSULE BY MOUTH DAILY, Disp: 90 capsule, Rfl: 3   predniSONE (DELTASONE) 50 MG tablet, Take '50mg'$  daily x5 days., Disp: 5 tablet, Rfl: 0  EXAM:  VITALS per patient if applicable: Ht '5\' 11"'$  (1.803 m)   Wt 198 lb 0.6 oz (89.8 kg)   BMI 27.62 kg/m   GENERAL: alert, oriented, appears well and in no acute distress  HEENT: atraumatic, conjunttiva clear, no obvious abnormalities on inspection of external nose and ears  NECK: normal movements of the head and neck  LUNGS: on inspection no signs of respiratory distress, breathing rate appears normal, no obvious gross SOB, gasping or wheezing  CV: no obvious cyanosis  MS: moves all visible extremities without noticeable abnormality  PSYCH/NEURO: pleasant and cooperative, no obvious depression or anxiety, speech and thought processing grossly intact  ASSESSMENT AND PLAN:  Discussed the following assessment and plan:  Acute sinusitis Start doxycycline and prednisone.  Recommend increased fluids.  Recommend humidifier in the bedroom and nasal saline rinses.  Contact clinic for in person visit if not improving.      I discussed the assessment and treatment plan with the patient. The patient was provided an opportunity to ask questions and all were answered. The patient agreed with the plan and demonstrated an understanding of the instructions.   The patient was advised to call back or seek an in-person evaluation if the symptoms worsen or if the condition fails to improve as anticipated.    [name], DO

## 2022-05-01 ENCOUNTER — Other Ambulatory Visit: Payer: Self-pay | Admitting: Family Medicine

## 2022-05-01 DIAGNOSIS — I10 Essential (primary) hypertension: Secondary | ICD-10-CM

## 2022-05-17 ENCOUNTER — Other Ambulatory Visit: Payer: Self-pay | Admitting: Family Medicine

## 2022-07-06 ENCOUNTER — Ambulatory Visit: Payer: BC Managed Care – PPO | Admitting: Family Medicine

## 2022-07-06 ENCOUNTER — Encounter: Payer: Self-pay | Admitting: Family Medicine

## 2022-07-06 VITALS — BP 114/74 | HR 59 | Ht 71.0 in | Wt 200.0 lb

## 2022-07-06 DIAGNOSIS — F4321 Adjustment disorder with depressed mood: Secondary | ICD-10-CM

## 2022-07-06 DIAGNOSIS — E039 Hypothyroidism, unspecified: Secondary | ICD-10-CM

## 2022-07-06 DIAGNOSIS — Z634 Disappearance and death of family member: Secondary | ICD-10-CM | POA: Diagnosis not present

## 2022-07-06 DIAGNOSIS — I10 Essential (primary) hypertension: Secondary | ICD-10-CM

## 2022-07-06 MED ORDER — BUPROPION HCL ER (XL) 150 MG PO TB24: 300.0000 mg | ORAL_TABLET | Freq: Every day | ORAL | 1 refills | Status: DC

## 2022-07-06 MED ORDER — LOSARTAN POTASSIUM 100 MG PO TABS: 100.0000 mg | ORAL_TABLET | Freq: Every day | ORAL | 1 refills | Status: DC

## 2022-07-06 MED ORDER — VALSARTAN 160 MG PO TABS: 160.0000 mg | ORAL_TABLET | Freq: Every day | ORAL | 1 refills | Status: DC

## 2022-07-06 NOTE — Assessment & Plan Note (Signed)
Feels good at current dose of levothyroxine.  Last TSH within normal limits.

## 2022-07-06 NOTE — Progress Notes (Signed)
Brian Russell - 62 y.o. male MRN [id number]  Date of birth: 1960/05/28  Subjective Chief Complaint  Patient presents with   Hypertension    HPI Brian Russell is a 62 y.o. male here today for follow up HTN.   BP is currently treated with chlorthalidone and losartan.  He is tolerating these well without side effects.  Has noted that his BP at home has been elevated.  Elevated at recent dentist visit as well. He has had headache at times.  Does admit to increased stress related to work as well as continued grief over the loss of his son last year.  His son's birthday was recently.  He feels that he may need to increase his bupropion again.  He is taking thyroid medication daily.  Feels that this is at a good strength.    ROS:  A comprehensive ROS was completed and negative except as noted per HPI  Allergies  Allergen Reactions   Amlodipine    Buspar [Buspirone]     Past Medical History:  Diagnosis Date   Hypertension    Low thyroid stimulating hormone (TSH) level     Past Surgical History:  Procedure Laterality Date   fractured bone     JOINT REPLACEMENT     SPINAL FUSION      Social History   Socioeconomic History   Marital status: Married    Spouse name: Not on file   Number of children: Not on file   Years of education: Not on file   Highest education level: Not on file  Occupational History   Occupation: Product manager  Tobacco Use   Smoking status: Former    Types: Cigarettes    Quit date: 05/2011    Years since quitting: 11.1   Smokeless tobacco: Never  Vaping Use   Vaping Use: Never used  Substance and Sexual Activity   Alcohol use: Yes    Alcohol/week: 2.0 standard drinks of alcohol    Types: 2 Standard drinks or equivalent per week   Drug use: Never   Sexual activity: Yes    Partners: Female  Other Topics Concern   Not on file  Social History Narrative   Not on file   Social Determinants of Health   Financial Resource Strain: Not on  file  Food Insecurity: Not on file  Transportation Needs: Not on file  Physical Activity: Not on file  Stress: Not on file  Social Connections: Not on file    Family History  Problem Relation Age of Onset   Hypertension Other    Heart attack Other    Stroke Other    Alzheimer's disease Mother    Stroke Father     Health Maintenance  Topic Date Due   Hepatitis C Screening  11/02/2022 (Originally 06/18/1977)   HIV Screening  11/02/2022 (Originally 06/18/1974)   COVID-19 Vaccine (3 - 2023-24 season) 07/22/2023 (Originally 01/13/2022)   Zoster Vaccines- Shingrix (2 of 2) 10/04/2023 (Originally 05/10/2022)   COLONOSCOPY (Pts 45-46yr Insurance coverage will need to be confirmed)  12/15/2027   DTaP/Tdap/Td (3 - Td or Tdap) 03/15/2032   INFLUENZA VACCINE  Completed   HPV VACCINES  Aged Out     ----------------------------------------------------------------------------------------------------------------------------------------------------------------------------------------------------------------- Physical Exam BP 114/74 (BP Location: Left Arm, Patient Position: Sitting, Cuff Size: Normal)   Pulse (!) 59   Ht 5' 11"$  (1.803 m)   Wt 200 lb (90.7 kg)   SpO2 97%   BMI 27.89 kg/m   Physical Exam Constitutional:  Appearance: Normal appearance.  HENT:     Head: Normocephalic and atraumatic.  Eyes:     General: No scleral icterus. Cardiovascular:     Rate and Rhythm: Normal rate and regular rhythm.  Pulmonary:     Effort: Pulmonary effort is normal.     Breath sounds: Normal breath sounds.  Musculoskeletal:     Cervical back: Neck supple.  Neurological:     General: No focal deficit present.     Mental Status: He is alert.  Psychiatric:        Mood and Affect: Mood normal.        Behavior: Behavior normal.      ------------------------------------------------------------------------------------------------------------------------------------------------------------------------------------------------------------------- Assessment and Plan  Essential hypertension Blood pressure is well-controlled here today.  Readings at home have been elevated.  Changing losartan to valsartan due to longer half-life as he is taking his medication at night.  Continue chlorthalidone at current strength.  Hypothyroidism Feels good at current dose of levothyroxine.  Last TSH within normal limits.  Grief at loss of child Offered continued support.  Not interested in counseling at this time.  Increasing bupropion to 300 mg daily.   Meds ordered this encounter  Medications   valsartan (DIOVAN) 160 MG tablet    Sig: Take 1 tablet (160 mg total) by mouth daily.    Dispense:  90 tablet    Refill:  1   buPROPion (WELLBUTRIN XL) 150 MG 24 hr tablet    Sig: Take 2 tablets (300 mg total) by mouth daily.    Dispense:  180 tablet    Refill:  1   losartan (COZAAR) 100 MG tablet    Sig: Take 1 tablet (100 mg total) by mouth daily.    Dispense:  90 tablet    Refill:  1    Return in about 6 weeks (around 08/17/2022) for F/u BP/Mood.    This visit occurred during the SARS-CoV-2 public health emergency.  Safety protocols were in place, including screening questions prior to the visit, additional usage of staff PPE, and extensive cleaning of exam room while observing appropriate contact time as indicated for disinfecting solutions.

## 2022-07-06 NOTE — Assessment & Plan Note (Signed)
Offered continued support.  Not interested in counseling at this time.  Increasing bupropion to 300 mg daily.

## 2022-07-06 NOTE — Patient Instructions (Addendum)
Stop losartan Start valsartan to replace this.  Increase bupropion to 2 tabs daily (353m total).

## 2022-07-06 NOTE — Assessment & Plan Note (Signed)
Blood pressure is well-controlled here today.  Readings at home have been elevated.  Changing losartan to valsartan due to longer half-life as he is taking his medication at night.  Continue chlorthalidone at current strength.

## 2022-07-07 ENCOUNTER — Other Ambulatory Visit: Payer: Self-pay

## 2022-07-07 MED ORDER — VALSARTAN 160 MG PO TABS: 160.0000 mg | ORAL_TABLET | Freq: Every day | ORAL | 1 refills | Status: DC

## 2022-07-10 ENCOUNTER — Other Ambulatory Visit: Payer: Self-pay | Admitting: Family Medicine

## 2022-07-10 DIAGNOSIS — M545 Low back pain, unspecified: Secondary | ICD-10-CM

## 2022-07-25 ENCOUNTER — Other Ambulatory Visit: Payer: Self-pay | Admitting: Family Medicine

## 2022-07-25 MED ORDER — DOXEPIN HCL 3 MG PO TABS: 1.0000 | ORAL_TABLET | Freq: Every day | ORAL | 0 refills | Status: DC

## 2022-08-07 ENCOUNTER — Other Ambulatory Visit: Payer: Self-pay | Admitting: Family Medicine

## 2022-08-07 MED ORDER — HYDROCODONE-ACETAMINOPHEN 5-325 MG PO TABS: 1.0000 | ORAL_TABLET | Freq: Four times a day (QID) | ORAL | 0 refills | Status: DC | PRN

## 2022-08-15 ENCOUNTER — Ambulatory Visit: Payer: BC Managed Care – PPO

## 2022-08-17 ENCOUNTER — Ambulatory Visit (INDEPENDENT_AMBULATORY_CARE_PROVIDER_SITE_OTHER): Payer: BC Managed Care – PPO | Admitting: Family Medicine

## 2022-08-17 VITALS — BP 137/82 | HR 54 | Ht 71.0 in | Wt 203.0 lb

## 2022-08-17 DIAGNOSIS — F4321 Adjustment disorder with depressed mood: Secondary | ICD-10-CM

## 2022-08-17 DIAGNOSIS — I10 Essential (primary) hypertension: Secondary | ICD-10-CM | POA: Diagnosis not present

## 2022-08-17 DIAGNOSIS — Z23 Encounter for immunization: Secondary | ICD-10-CM

## 2022-08-17 MED ORDER — BUPROPION HCL ER (XL) 300 MG PO TB24: 300.0000 mg | ORAL_TABLET | Freq: Every day | ORAL | 2 refills | Status: DC

## 2022-08-17 NOTE — Assessment & Plan Note (Signed)
Blood pressure remains fairly well-controlled.  I recommend that he continue valsartan and chlorthalidone at current strength.

## 2022-08-17 NOTE — Assessment & Plan Note (Signed)
Doing well with bupropion at current strength.  Continue at 300 mg daily.

## 2022-08-17 NOTE — Progress Notes (Signed)
Brian Russell - 62 y.o. male MRN [id number]  Date of birth: [birth year]  Subjective No chief complaint on file.   HPI Brian Russell is a 62 y.o. male here today for follow up of BP and mood.  He reports he is doing quite well.  We did switch from losartan to valsartan at previous visit.  He is tolerating this well.  Blood pressures at home have looked better.  No side effects with current strength.  Denies chest pain, shortness of breath, palpitations, headaches or vision changes.  Increasing bupropion has been effective.  No side effects at current strength.  Will prefer to continue this.  Needs updated prescription.  ROS:  A comprehensive ROS was completed and negative except as noted per HPI  Allergies  Allergen Reactions   Amlodipine    Buspar [Buspirone]     Past Medical History:  Diagnosis Date   Hypertension    Low thyroid stimulating hormone (TSH) level     Past Surgical History:  Procedure Laterality Date   fractured bone     JOINT REPLACEMENT     SPINAL FUSION      Social History   Socioeconomic History   Marital status: Married    Spouse name: Not on file   Number of children: Not on file   Years of education: Not on file   Highest education level: Some college, no degree  Occupational History   Occupation: Product manager  Tobacco Use   Smoking status: Former    Types: Cigarettes    Quit date: 05/2011    Years since quitting: 11.2   Smokeless tobacco: Never  Vaping Use   Vaping Use: Never used  Substance and Sexual Activity   Alcohol use: Yes    Alcohol/week: 2.0 standard drinks of alcohol    Types: 2 Standard drinks or equivalent per week   Drug use: Never   Sexual activity: Yes    Partners: Female  Other Topics Concern   Not on file  Social History Narrative   Not on file   Social Determinants of Health   Financial Resource Strain: Low Risk  (08/17/2022)   Overall Financial Resource Strain (CARDIA)    Difficulty of Paying Living  Expenses: Not hard at all  Food Insecurity: No Food Insecurity (08/17/2022)   Hunger Vital Sign    Worried About Running Out of Food in the Last Year: Never true    Ran Out of Food in the Last Year: Never true  Transportation Needs: No Transportation Needs (08/17/2022)   PRAPARE - Hydrologist (Medical): No    Lack of Transportation (Non-Medical): No  Physical Activity: Insufficiently Active (08/17/2022)   Exercise Vital Sign    Days of Exercise per Week: 3 days    Minutes of Exercise per Session: 30 min  Stress: Stress Concern Present (08/17/2022)   [place]    Feeling of Stress : To some extent  Social Connections: Socially Integrated (08/17/2022)   Social Connection and Isolation Panel [NHANES]    Frequency of Communication with Friends and Family: More than three times a week    Frequency of Social Gatherings with Friends and Family: More than three times a week    Attends Religious Services: More than 4 times per year    Active Member of [employer] or Organizations: Yes    Attends Archivist Meetings: More than 4 times per year  Marital Status: Married    Family History  Problem Relation Age of Onset   Hypertension Other    Heart attack Other    Stroke Other    Alzheimer's disease Mother    Stroke Father     Health Maintenance  Topic Date Due   Hepatitis C Screening  11/02/2022 (Originally [birth year]/1977)   HIV Screening  11/02/2022 (Originally 06/18/1974)   COVID-19 Vaccine (3 - 2023-24 season) 07/22/2023 (Originally 01/13/2022)   INFLUENZA VACCINE  12/14/2022   COLONOSCOPY (Pts 45-22yrs Insurance coverage will need to be confirmed)  12/15/2027   DTaP/Tdap/Td (3 - Td or Tdap) 03/15/2032   Zoster Vaccines- Shingrix  Completed   HPV VACCINES  Aged Out      ----------------------------------------------------------------------------------------------------------------------------------------------------------------------------------------------------------------- Physical Exam BP 137/82 (BP Location: Left Arm, Patient Position: Sitting, Cuff Size: Normal)   Pulse (!) 54   Ht 5\' 11"  (1.803 m)   Wt 203 lb (92.1 kg)   SpO2 98%   BMI 28.31 kg/m   Physical Exam Constitutional:      Appearance: Normal appearance.  HENT:     Head: Normocephalic and atraumatic.  Eyes:     General: No scleral icterus. Neurological:     Mental Status: He is alert.  Psychiatric:        Mood and Affect: Mood normal.        Behavior: Behavior normal.     ------------------------------------------------------------------------------------------------------------------------------------------------------------------------------------------------------------------- Assessment and Plan  Essential hypertension Blood pressure remains fairly well-controlled.  I recommend that he continue valsartan and chlorthalidone at current strength.  Grief at loss of child Doing well with bupropion at current strength.  Continue at 300 mg daily.   Meds ordered this encounter  Medications   buPROPion (WELLBUTRIN XL) 300 MG 24 hr tablet    Sig: Take 1 tablet (300 mg total) by mouth daily.    Dispense:  90 tablet    Refill:  2    Return in about 6 months (around 02/16/2023) for HTN.    This visit occurred during the SARS-CoV-2 public health emergency.  Safety protocols were in place, including screening questions prior to the visit, additional usage of staff PPE, and extensive cleaning of exam room while observing appropriate contact time as indicated for disinfecting solutions.

## 2022-09-04 ENCOUNTER — Other Ambulatory Visit: Payer: Self-pay | Admitting: Family Medicine

## 2022-09-07 ENCOUNTER — Other Ambulatory Visit: Payer: Self-pay | Admitting: Family Medicine

## 2022-10-05 ENCOUNTER — Ambulatory Visit (INDEPENDENT_AMBULATORY_CARE_PROVIDER_SITE_OTHER): Payer: BC Managed Care – PPO

## 2022-10-05 ENCOUNTER — Ambulatory Visit: Payer: BC Managed Care – PPO | Admitting: Family Medicine

## 2022-10-05 ENCOUNTER — Encounter: Payer: Self-pay | Admitting: Family Medicine

## 2022-10-05 VITALS — BP 135/82 | HR 59 | Ht 71.0 in | Wt 199.0 lb

## 2022-10-05 DIAGNOSIS — M5412 Radiculopathy, cervical region: Secondary | ICD-10-CM

## 2022-10-05 DIAGNOSIS — R200 Anesthesia of skin: Secondary | ICD-10-CM | POA: Diagnosis not present

## 2022-10-05 DIAGNOSIS — M47812 Spondylosis without myelopathy or radiculopathy, cervical region: Secondary | ICD-10-CM | POA: Diagnosis not present

## 2022-10-05 MED ORDER — METHYLPREDNISOLONE ACETATE 40 MG/ML IJ SUSP
40.0000 mg | Freq: Once | INTRAMUSCULAR | Status: AC
Start: 2022-10-05 — End: 2022-10-05
  Administered 2022-10-05: 40 mg via INTRAMUSCULAR

## 2022-10-05 MED ORDER — PREDNISONE 50 MG PO TABS: ORAL_TABLET | ORAL | 0 refills | Status: DC

## 2022-10-05 MED ORDER — TIZANIDINE HCL 4 MG PO TABS: 4.0000 mg | ORAL_TABLET | Freq: Four times a day (QID) | ORAL | 0 refills | Status: DC | PRN

## 2022-10-05 NOTE — Patient Instructions (Signed)
Start prednisone tomorrow morning.  Tizanidine as needed at night.  Do not take while driving! We'll be in touch with xray results.

## 2022-10-09 NOTE — Progress Notes (Signed)
Brian Russell - 62 y.o. male MRN [id number]  Date of birth: [birth year]  Subjective Chief Complaint  Patient presents with   Extremity Weakness    HPI Brian Russell is a 62 year old male here today with complaint of left arm pain and numbness in his hand.  Symptoms started a few days ago.  He does not recall any injury.  He has not tried any thing so far to help with his symptoms.  He has had similar symptoms in the past but not to this severity.  ROS:  A comprehensive ROS was completed and negative except as noted per HPI  Allergies  Allergen Reactions   Amlodipine    Buspar [Buspirone]     Past Medical History:  Diagnosis Date   Hypertension    Low thyroid stimulating hormone (TSH) level     Past Surgical History:  Procedure Laterality Date   fractured bone     JOINT REPLACEMENT     SPINAL FUSION      Social History   Socioeconomic History   Marital status: Married    Spouse name: Not on file   Number of children: Not on file   Years of education: Not on file   Highest education level: Some college, no degree  Occupational History   Occupation: Building services engineer  Tobacco Use   Smoking status: Former    Types: Cigarettes    Quit date: 05/2011    Years since quitting: 11.4   Smokeless tobacco: Never  Vaping Use   Vaping Use: Never used  Substance and Sexual Activity   Alcohol use: Yes    Alcohol/week: 2.0 standard drinks of alcohol    Types: 2 Standard drinks or equivalent per week   Drug use: Never   Sexual activity: Yes    Partners: Female  Other Topics Concern   Not on file  Social History Narrative   Not on file   Social Determinants of Health   Financial Resource Strain: Low Risk  (08/17/2022)   Overall Financial Resource Strain (CARDIA)    Difficulty of Paying Living Expenses: Not hard at all  Food Insecurity: No Food Insecurity (08/17/2022)   Hunger Vital Sign    Worried About Running Out of Food in the Last Year: Never true    Ran Out of Food in the  Last Year: Never true  Transportation Needs: No Transportation Needs (08/17/2022)   PRAPARE - Administrator, Civil Service (Medical): No    Lack of Transportation (Non-Medical): No  Physical Activity: Insufficiently Active (08/17/2022)   Exercise Vital Sign    Days of Exercise per Week: 3 days    Minutes of Exercise per Session: 30 min  Stress: Stress Concern Present (08/17/2022)   [name] of Occupational Health - Occupational Stress Questionnaire    Feeling of Stress : To some extent  Social Connections: Socially Integrated (08/17/2022)   Social Connection and Isolation Panel [NHANES]    Frequency of Communication with Friends and Family: More than three times a week    Frequency of Social Gatherings with Friends and Family: More than three times a week    Attends Religious Services: More than 4 times per year    Active Member of [employer] or Organizations: Yes    Attends Engineer, structural: More than 4 times per year    Marital Status: Married    Family History  Problem Relation Age of Onset   Hypertension Other    Heart attack Other  Stroke Other    Alzheimer's disease Mother    Stroke Father     Health Maintenance  Topic Date Due   Hepatitis C Screening  11/02/2022 (Originally 06/18/1977)   HIV Screening  11/02/2022 (Originally 06/18/1974)   COVID-19 Vaccine (3 - 2023-24 season) 07/22/2023 (Originally 01/13/2022)   INFLUENZA VACCINE  12/14/2022   Colonoscopy  12/15/2027   DTaP/Tdap/Td (3 - Td or Tdap) 03/15/2032   Zoster Vaccines- Shingrix  Completed   HPV VACCINES  Aged Out     ----------------------------------------------------------------------------------------------------------------------------------------------------------------------------------------------------------------- Physical Exam BP 135/82 (BP Location: Right Arm, Patient Position: Sitting, Cuff Size: Large)   Pulse (!) 59   Ht 5\' 11"  (1.803 m)   Wt 199 lb (90.3 kg)   SpO2 97%    BMI 27.75 kg/m   Physical Exam Constitutional:      Appearance: Normal appearance.  Eyes:     General: No scleral icterus. Musculoskeletal:     Cervical back: Normal range of motion.     Comments: Slightly decreased grip strength on the left compared to the right.  Neurological:     Mental Status: He is alert.  Psychiatric:        Mood and Affect: Mood normal.        Behavior: Behavior normal.     ------------------------------------------------------------------------------------------------------------------------------------------------------------------------------------------------------------------- Assessment and Plan  Cervical spondylosis Cervical spondylosis now with radicular signs on the left.  Given injection of Depo-Medrol and will start prednisone burst starting tomorrow.  Updated x-rays of cervical spine ordered.  Tizanidine as needed for associated spasm.  Given handout for home exercises.   Meds ordered this encounter  Medications   predniSONE (DELTASONE) 50 MG tablet    Sig: Take 50mg  daily x5 days.    Dispense:  5 tablet    Refill:  0   tiZANidine (ZANAFLEX) 4 MG tablet    Sig: Take 1 tablet (4 mg total) by mouth every 6 (six) hours as needed for muscle spasms.    Dispense:  30 tablet    Refill:  0   methylPREDNISolone acetate (DEPO-MEDROL) injection 40 mg    No follow-ups on file.    This visit occurred during the SARS-CoV-2 public health emergency.  Safety protocols were in place, including screening questions prior to the visit, additional usage of staff PPE, and extensive cleaning of exam room while observing appropriate contact time as indicated for disinfecting solutions.

## 2022-10-09 NOTE — Assessment & Plan Note (Signed)
Cervical spondylosis now with radicular signs on the left.  Given injection of Depo-Medrol and will start prednisone burst starting tomorrow.  Updated x-rays of cervical spine ordered.  Tizanidine as needed for associated spasm.  Given handout for home exercises.

## 2022-10-10 ENCOUNTER — Encounter: Payer: Self-pay | Admitting: Family Medicine

## 2022-10-10 MED ORDER — GABAPENTIN 300 MG PO CAPS: 300.0000 mg | ORAL_CAPSULE | Freq: Three times a day (TID) | ORAL | 3 refills | Status: DC

## 2022-10-10 NOTE — Telephone Encounter (Signed)
I called [facility]. They state they will have it read stat.

## 2022-10-11 NOTE — Telephone Encounter (Signed)
Sent to [employer] [facility] yesterday.   CM

## 2022-10-29 ENCOUNTER — Other Ambulatory Visit: Payer: Self-pay | Admitting: Family Medicine

## 2022-10-31 ENCOUNTER — Other Ambulatory Visit: Payer: Self-pay | Admitting: Family Medicine

## 2022-10-31 DIAGNOSIS — M545 Low back pain, unspecified: Secondary | ICD-10-CM

## 2022-12-14 ENCOUNTER — Other Ambulatory Visit: Payer: Self-pay | Admitting: Family Medicine

## 2022-12-14 MED ORDER — HYDROCODONE-ACETAMINOPHEN 5-325 MG PO TABS: 1.0000 | ORAL_TABLET | Freq: Four times a day (QID) | ORAL | 0 refills | Status: DC | PRN

## 2023-02-07 ENCOUNTER — Other Ambulatory Visit: Payer: Self-pay | Admitting: Family Medicine

## 2023-02-15 ENCOUNTER — Other Ambulatory Visit: Payer: Self-pay | Admitting: Family Medicine

## 2023-02-19 ENCOUNTER — Ambulatory Visit: Payer: BC Managed Care – PPO | Admitting: Family Medicine

## 2023-02-19 ENCOUNTER — Encounter: Payer: Self-pay | Admitting: Family Medicine

## 2023-02-19 VITALS — BP 120/75 | HR 58 | Ht 71.0 in | Wt 199.0 lb

## 2023-02-19 DIAGNOSIS — I10 Essential (primary) hypertension: Secondary | ICD-10-CM

## 2023-02-19 DIAGNOSIS — M25511 Pain in right shoulder: Secondary | ICD-10-CM

## 2023-02-19 DIAGNOSIS — Z0000 Encounter for general adult medical examination without abnormal findings: Secondary | ICD-10-CM

## 2023-02-19 DIAGNOSIS — E785 Hyperlipidemia, unspecified: Secondary | ICD-10-CM

## 2023-02-19 DIAGNOSIS — E039 Hypothyroidism, unspecified: Secondary | ICD-10-CM

## 2023-02-19 MED ORDER — CHLORTHALIDONE 25 MG PO TABS: 25.0000 mg | ORAL_TABLET | Freq: Every day | ORAL | 1 refills | Status: DC

## 2023-02-19 MED ORDER — BUPROPION HCL ER (XL) 300 MG PO TB24: 300.0000 mg | ORAL_TABLET | Freq: Every day | ORAL | 3 refills | Status: DC

## 2023-02-19 MED ORDER — DOXEPIN HCL 3 MG PO TABS
1.0000 | ORAL_TABLET | Freq: Every day | ORAL | 1 refills | Status: DC
Start: 2023-02-19 — End: 2023-06-04

## 2023-02-19 NOTE — Assessment & Plan Note (Signed)
Seems more radicular in nature.  He will plan to schedule with Dr. Benjamin Stain for this.

## 2023-02-19 NOTE — Progress Notes (Signed)
Brian Russell - 62 y.o. male MRN [id number]  Date of birth: January 17, 1961  Subjective Chief Complaint  Patient presents with   Hypertension    HPI Brian Russell is a 62 y.o. male here today for annual exam.   He reports that he is doing pretty well.  He continues to stay pretty active.  L shoulder continues to bother him.   He has had radiation into the L arm as well.  DDD with foraminal stenosis at C3-4 on the L and C5-6 on the R.    He feels that diet is pretty good.  BP is well controlled.   Non-smoker.  Occasional EtOH during the week.   Review of Systems  Constitutional:  Negative for chills, fever, malaise/fatigue and weight loss.  HENT:  Negative for congestion, ear pain and sore throat.   Eyes:  Negative for blurred vision, double vision and pain.  Respiratory:  Negative for cough and shortness of breath.   Cardiovascular:  Negative for chest pain and palpitations.  Gastrointestinal:  Negative for abdominal pain, blood in stool, constipation, heartburn and nausea.  Genitourinary:  Negative for dysuria and urgency.  Musculoskeletal:  Negative for joint pain and myalgias.  Neurological:  Negative for dizziness and headaches.  Endo/Heme/Allergies:  Does not bruise/bleed easily.  Psychiatric/Behavioral:  Negative for depression. The patient is not nervous/anxious and does not have insomnia.     Allergies  Allergen Reactions   Amlodipine    Buspar [Buspirone]     Past Medical History:  Diagnosis Date   Hypertension    Low thyroid stimulating hormone (TSH) level     Past Surgical History:  Procedure Laterality Date   fractured bone     JOINT REPLACEMENT     SPINAL FUSION      Social History   Socioeconomic History   Marital status: Married    Spouse name: Not on file   Number of children: Not on file   Years of education: Not on file   Highest education level: Some college, no degree  Occupational History   Occupation: Building services engineer  Tobacco Use    Smoking status: Former    Current packs/day: 0.00    Types: Cigarettes    Quit date: 05/2011    Years since quitting: 11.7   Smokeless tobacco: Never  Vaping Use   Vaping status: Never Used  Substance and Sexual Activity   Alcohol use: Yes    Alcohol/week: 2.0 standard drinks of alcohol    Types: 2 Standard drinks or equivalent per week   Drug use: Never   Sexual activity: Yes    Partners: Female  Other Topics Concern   Not on file  Social History Narrative   Not on file   Social Determinants of Health   Financial Resource Strain: Low Risk  (08/17/2022)   Overall Financial Resource Strain (CARDIA)    Difficulty of Paying Living Expenses: Not hard at all  Food Insecurity: No Food Insecurity (08/17/2022)   Hunger Vital Sign    Worried About Running Out of Food in the Last Year: Never true    Ran Out of Food in the Last Year: Never true  Transportation Needs: No Transportation Needs (08/17/2022)   PRAPARE - Administrator, Civil Service (Medical): No    Lack of Transportation (Non-Medical): No  Physical Activity: Insufficiently Active (08/17/2022)   Exercise Vital Sign    Days of Exercise per Week: 3 days    Minutes of Exercise per  Session: 30 min  Stress: Stress Concern Present (08/17/2022)   [name] of Occupational Health - Occupational Stress Questionnaire    Feeling of Stress : To some extent  Social Connections: Socially Integrated (08/17/2022)   Social Connection and Isolation Panel [NHANES]    Frequency of Communication with Friends and Family: More than three times a week    Frequency of Social Gatherings with Friends and Family: More than three times a week    Attends Religious Services: More than 4 times per year    Active Member of Clubs or Organizations: Yes    Attends Engineer, structural: More than 4 times per year    Marital Status: Married    Family History  Problem Relation Age of Onset   Hypertension Other    Heart attack Other     Stroke Other    Alzheimer's disease Mother    Stroke Father     Health Maintenance  Topic Date Due   HIV Screening  Never done   Hepatitis C Screening  Never done   INFLUENZA VACCINE  12/14/2022   COVID-19 Vaccine (3 - 2023-24 season) 01/14/2023   Colonoscopy  12/15/2027   DTaP/Tdap/Td (3 - Td or Tdap) 03/15/2032   Zoster Vaccines- Shingrix  Completed   HPV VACCINES  Aged Out     ----------------------------------------------------------------------------------------------------------------------------------------------------------------------------------------------------------------- Physical Exam BP 120/75 (BP Location: Left Arm, Patient Position: Sitting, Cuff Size: Normal)   Pulse (!) 58   Ht 5\' 11"  (1.803 m)   Wt 199 lb (90.3 kg)   SpO2 96%   BMI 27.75 kg/m   Physical Exam Constitutional:      General: He is not in acute distress. HENT:     Head: Normocephalic and atraumatic.     Right Ear: Tympanic membrane and external ear normal.     Left Ear: Tympanic membrane and external ear normal.  Eyes:     General: No scleral icterus. Neck:     Thyroid: No thyromegaly.  Cardiovascular:     Rate and Rhythm: Normal rate and regular rhythm.     Heart sounds: Normal heart sounds.  Pulmonary:     Effort: Pulmonary effort is normal.     Breath sounds: Normal breath sounds.  Abdominal:     General: Bowel sounds are normal. There is no distension.     Palpations: Abdomen is soft.     Tenderness: There is no abdominal tenderness. There is no guarding.  Musculoskeletal:     Cervical back: Normal range of motion.  Lymphadenopathy:     Cervical: No cervical adenopathy.  Skin:    General: Skin is warm and dry.     Findings: No rash.  Neurological:     Mental Status: He is alert and oriented to person, place, and time.     Cranial Nerves: No cranial nerve deficit.     Motor: No abnormal muscle tone.  Psychiatric:        Mood and Affect: Mood normal.        Behavior:  Behavior normal.     ------------------------------------------------------------------------------------------------------------------------------------------------------------------------------------------------------------------- Assessment and Plan  Right shoulder pain Seems more radicular in nature.  He will plan to schedule with Dr. Benjamin Stain for this.    Well adult exam Well adult Orders Placed This Encounter  Procedures   CBC with Differential/Platelet   CMP14+EGFR   Lipid Panel With LDL/HDL Ratio   PSA   TSH  Screenings: Per lab orders Immunizations: Flu vaccine given today. Anticipatory guidance/risk factor reduction: Recommendations  per AVS.   Meds ordered this encounter  Medications   chlorthalidone (HYGROTON) 25 MG tablet    Sig: Take 1 tablet (25 mg total) by mouth daily.    Dispense:  90 tablet    Refill:  1   buPROPion (WELLBUTRIN XL) 300 MG 24 hr tablet    Sig: Take 1 tablet (300 mg total) by mouth daily.    Dispense:  90 tablet    Refill:  3   Doxepin HCl 3 MG TABS    Sig: Take 1 tablet (3 mg total) by mouth at bedtime.    Dispense:  90 tablet    Refill:  1    No follow-ups on file.    This visit occurred during the SARS-CoV-2 public health emergency.  Safety protocols were in place, including screening questions prior to the visit, additional usage of staff PPE, and extensive cleaning of exam room while observing appropriate contact time as indicated for disinfecting solutions.

## 2023-02-19 NOTE — Assessment & Plan Note (Signed)
Well adult Orders Placed This Encounter  Procedures   CBC with Differential/Platelet   CMP14+EGFR   Lipid Panel With LDL/HDL Ratio   PSA   TSH  Screenings: Per lab orders Immunizations: Flu vaccine given today. Anticipatory guidance/risk factor reduction: Recommendations per AVS.

## 2023-02-20 LAB — CBC WITH DIFFERENTIAL/PLATELET
Basophils Absolute: 0.1 10*3/uL (ref 0.0–0.2)
Basos: 1 %
EOS (ABSOLUTE): 0.2 10*3/uL (ref 0.0–0.4)
Eos: 3 %
Hematocrit: 44.2 % (ref 37.5–51.0)
Hemoglobin: 14.5 g/dL (ref 13.0–17.7)
Immature Grans (Abs): 0 10*3/uL (ref 0.0–0.1)
Immature Granulocytes: 0 %
Lymphocytes Absolute: 1.4 10*3/uL (ref 0.7–3.1)
Lymphs: 24 %
MCH: 30.7 pg (ref 26.6–33.0)
MCHC: 32.8 g/dL (ref 31.5–35.7)
MCV: 93 fL (ref 79–97)
Monocytes Absolute: 0.4 10*3/uL (ref 0.1–0.9)
Monocytes: 7 %
Neutrophils Absolute: 4 10*3/uL (ref 1.4–7.0)
Neutrophils: 65 %
Platelets: 171 10*3/uL (ref 150–450)
RBC: 4.73 x10E6/uL (ref 4.14–5.80)
RDW: 12.7 % (ref 11.6–15.4)
WBC: 6.1 10*3/uL (ref 3.4–10.8)

## 2023-02-20 LAB — PSA: Prostate Specific Ag, Serum: 0.4 ng/mL (ref 0.0–4.0)

## 2023-02-20 LAB — CMP14+EGFR
ALT: 13 [IU]/L (ref 0–44)
AST: 21 [IU]/L (ref 0–40)
Albumin: 4.5 g/dL (ref 3.9–4.9)
Alkaline Phosphatase: 52 [IU]/L (ref 44–121)
BUN/Creatinine Ratio: 18 (ref 10–24)
BUN: 17 mg/dL (ref 8–27)
Bilirubin Total: 0.5 mg/dL (ref 0.0–1.2)
CO2: 28 mmol/L (ref 20–29)
Calcium: 9.3 mg/dL (ref 8.6–10.2)
Chloride: 99 mmol/L (ref 96–106)
Creatinine, Ser: 0.96 mg/dL (ref 0.76–1.27)
Globulin, Total: 1.8 g/dL (ref 1.5–4.5)
Glucose: 83 mg/dL (ref 70–99)
Potassium: 3.9 mmol/L (ref 3.5–5.2)
Sodium: 140 mmol/L (ref 134–144)
Total Protein: 6.3 g/dL (ref 6.0–8.5)
eGFR: 89 mL/min/{1.73_m2} (ref >59)

## 2023-02-20 LAB — TSH: TSH: 0.985 u[IU]/mL (ref 0.450–4.500)

## 2023-02-20 LAB — LIPID PANEL WITH LDL/HDL RATIO
Cholesterol, Total: 196 mg/dL (ref 100–199)
HDL: 51 mg/dL (ref >39)
LDL Chol Calc (NIH): 128 mg/dL — ABNORMAL HIGH (ref 0–99)
LDL/HDL Ratio: 2.5 {ratio} (ref 0.0–3.6)
Triglycerides: 95 mg/dL (ref 0–149)
VLDL Cholesterol Cal: 17 mg/dL (ref 5–40)

## 2023-02-22 ENCOUNTER — Encounter: Payer: Self-pay | Admitting: Family Medicine

## 2023-02-25 ENCOUNTER — Other Ambulatory Visit: Payer: Self-pay | Admitting: Family Medicine

## 2023-02-25 DIAGNOSIS — M545 Low back pain, unspecified: Secondary | ICD-10-CM

## 2023-03-06 ENCOUNTER — Encounter: Payer: Self-pay | Admitting: Sports Medicine

## 2023-03-06 ENCOUNTER — Ambulatory Visit: Payer: BC Managed Care – PPO | Admitting: Sports Medicine

## 2023-03-06 ENCOUNTER — Ambulatory Visit: Payer: BC Managed Care – PPO

## 2023-03-06 DIAGNOSIS — G8929 Other chronic pain: Secondary | ICD-10-CM

## 2023-03-06 DIAGNOSIS — M25512 Pain in left shoulder: Secondary | ICD-10-CM

## 2023-03-06 DIAGNOSIS — M25511 Pain in right shoulder: Secondary | ICD-10-CM

## 2023-03-06 DIAGNOSIS — M19011 Primary osteoarthritis, right shoulder: Secondary | ICD-10-CM | POA: Diagnosis not present

## 2023-03-06 DIAGNOSIS — M19012 Primary osteoarthritis, left shoulder: Secondary | ICD-10-CM | POA: Diagnosis not present

## 2023-03-06 DIAGNOSIS — M47812 Spondylosis without myelopathy or radiculopathy, cervical region: Secondary | ICD-10-CM | POA: Diagnosis not present

## 2023-03-06 MED ORDER — NAPROXEN 500 MG PO TABS
500.0000 mg | ORAL_TABLET | Freq: Two times a day (BID) | ORAL | 3 refills | Status: DC
Start: 2023-03-06 — End: 2023-06-04

## 2023-03-06 NOTE — Assessment & Plan Note (Addendum)
Pleasant 62 year old male, he has a history of cervical degenerative disc disease, his neck pain is well-controlled with gabapentin. He is complaining of pain bilateral shoulders over the deltoid pain, right worse than left. No real exacerbating or palliating factors on the history. On exam he has a positive crank test, he has no impingement signs, rotator cuff strength is good, negative speeds test. I do suspect some degree of glenohumeral osteoarthritis. We will switch from meloxicam which she has been on for a year to naproxen twice a day, sometimes we need to rotate medications every year or 2. We will also get bilateral shoulder x-rays and he will do shoulder home physical therapy. Will do this for 4 to 6 weeks before considering glenohumeral joint injection.

## 2023-03-06 NOTE — Progress Notes (Signed)
    Procedures performed today:    None.  Independent interpretation of notes and tests performed by another provider:   None.  Brief History, Exam, Impression, and Recommendations:    Bilateral shoulder pain Pleasant 62 year old male, he has a history of cervical degenerative disc disease, his neck pain is well-controlled with gabapentin. He is complaining of pain bilateral shoulders over the deltoid pain, right worse than left. No real exacerbating or palliating factors on the history. On exam he has a positive crank test, he has no impingement signs, rotator cuff strength is good, negative speeds test. I do suspect some degree of glenohumeral osteoarthritis. We will switch from meloxicam which she has been on for a year to naproxen twice a day, sometimes we need to rotate medications every year or 2. We will also get bilateral shoulder x-rays and he will do shoulder home physical therapy. Will do this for 4 to 6 weeks before considering glenohumeral joint injection.  Cervical spondylosis Multilevel cervical degenerative disc disease, he has done well with cervical spine conditioning, tizanidine and gabapentin. He does take an occasional half tab hydrocodone as needed. No further intervention needed here.    ____________________________________________ Ihor Austin. Benjamin Stain, M.D., ABFM., CAQSM., AME. Primary Care and Sports Medicine [facility]  Adjunct Professor of Family Medicine  [employer]  Restaurant manager, fast food

## 2023-03-06 NOTE — Assessment & Plan Note (Addendum)
Multilevel cervical degenerative disc disease, he has done well with cervical spine conditioning, tizanidine and gabapentin. He does take an occasional half tab hydrocodone as needed. No further intervention needed here.

## 2023-04-07 ENCOUNTER — Other Ambulatory Visit: Payer: Self-pay | Admitting: Family Medicine

## 2023-04-25 ENCOUNTER — Other Ambulatory Visit: Payer: Self-pay | Admitting: Family Medicine

## 2023-04-25 MED ORDER — HYDROCODONE-ACETAMINOPHEN 5-325 MG PO TABS: 1.0000 | ORAL_TABLET | Freq: Four times a day (QID) | ORAL | 0 refills | Status: DC | PRN

## 2023-04-25 MED ORDER — LEVOTHYROXINE SODIUM 125 MCG PO TABS: ORAL_TABLET | ORAL | 0 refills | Status: DC

## 2023-04-25 NOTE — Telephone Encounter (Signed)
Last appointment 02/19/2023  Last filled 12/14/2022  Upcoming appointment 08/20/2023

## 2023-05-01 ENCOUNTER — Telehealth: Payer: Self-pay

## 2023-05-01 ENCOUNTER — Other Ambulatory Visit: Payer: Self-pay | Admitting: Family Medicine

## 2023-05-01 MED ORDER — LEVOTHYROXINE SODIUM 125 MCG PO TABS: 125.0000 ug | ORAL_TABLET | Freq: Every day | ORAL | 3 refills | Status: DC

## 2023-05-01 NOTE — Telephone Encounter (Signed)
Patient informed. 

## 2023-05-01 NOTE — Telephone Encounter (Signed)
Copied from CRM [id number]. Topic: Clinical - Prescription Issue >> Apr 30, 2023 12:28 PM Danika B wrote: Reason for CRM: Patient called in stating that his rx for Levothyroxine is incorrect. States that he is supposed to be taking everyday. Says that the rx was sent in 12 pills only, from an old method of taking the rx that the Dr already stopped. Callback [phone]

## 2023-06-04 ENCOUNTER — Encounter: Payer: Self-pay | Admitting: Family Medicine

## 2023-06-04 ENCOUNTER — Ambulatory Visit: Payer: BC Managed Care – PPO | Admitting: Family Medicine

## 2023-06-04 VITALS — BP 126/78 | HR 56 | Ht 71.0 in | Wt 203.0 lb

## 2023-06-04 DIAGNOSIS — Z8241 Family history of sudden cardiac death: Secondary | ICD-10-CM | POA: Diagnosis not present

## 2023-06-04 DIAGNOSIS — I10 Essential (primary) hypertension: Secondary | ICD-10-CM

## 2023-06-04 DIAGNOSIS — Z0000 Encounter for general adult medical examination without abnormal findings: Secondary | ICD-10-CM

## 2023-06-04 DIAGNOSIS — Z8249 Family history of ischemic heart disease and other diseases of the circulatory system: Secondary | ICD-10-CM

## 2023-06-04 MED ORDER — DOXEPIN HCL 3 MG PO TABS: 1.0000 | ORAL_TABLET | Freq: Every day | ORAL | 1 refills | Status: DC

## 2023-06-04 NOTE — Progress Notes (Signed)
Brian Russell - 63 y.o. male MRN [id number]  Date of birth: 1960/07/15  Subjective Chief Complaint  Patient presents with   Follow-up         HPI Brian Russell is a 63 y.o. male here today for follow up.   His 58 year old son unexpectedly passed away in 2021-07-09.  They have recently received results of autopsy which showed cardiomegaly with left ventricular hypertrophy without signs of hyertrophic cardiomyopathy.  Coronaries were normal.   He is concerned that he could have this condition as well.  His other son is getting checked for this as well.   He has not had any symptoms such as chest pain, dyspnea or palpitations.  His BP is well controlled.   ROS:  A comprehensive ROS was completed and negative except as noted per HPI  Allergies  Allergen Reactions   Amlodipine    Buspar [Buspirone]     Past Medical History:  Diagnosis Date   Hypertension    Low thyroid stimulating hormone (TSH) level     Past Surgical History:  Procedure Laterality Date   fractured bone     JOINT REPLACEMENT     SPINAL FUSION      Social History   Socioeconomic History   Marital status: Married    Spouse name: Not on file   Number of children: Not on file   Years of education: Not on file   Highest education level: Associate degree: occupational, Scientist, product/process development, or vocational program  Occupational History   Occupation: Building services engineer  Tobacco Use   Smoking status: Former    Current packs/day: 0.00    Types: Cigarettes    Quit date: 2011/07/10    Years since quitting: 12.0   Smokeless tobacco: Never  Vaping Use   Vaping status: Never Used  Substance and Sexual Activity   Alcohol use: Yes    Alcohol/week: 2.0 standard drinks of alcohol    Types: 2 Standard drinks or equivalent per week   Drug use: Never   Sexual activity: Yes    Partners: Female  Other Topics Concern   Not on file  Social History Narrative   Not on file   Social Drivers of Health   Financial Resource Strain:  Low Risk  (06/03/2023)   Overall Financial Resource Strain (CARDIA)    Difficulty of Paying Living Expenses: Not hard at all  Food Insecurity: No Food Insecurity (06/03/2023)   Hunger Vital Sign    Worried About Running Out of Food in the Last Year: Never true    Ran Out of Food in the Last Year: Never true  Transportation Needs: No Transportation Needs (06/03/2023)   PRAPARE - Administrator, Civil Service (Medical): No    Lack of Transportation (Non-Medical): No  Physical Activity: Insufficiently Active (06/03/2023)   Exercise Vital Sign    Days of Exercise per Week: 3 days    Minutes of Exercise per Session: 20 min  Stress: No Stress Concern Present (06/03/2023)   [name] of Occupational Health - Occupational Stress Questionnaire    Feeling of Stress : Only a little  Social Connections: Socially Integrated (06/03/2023)   Social Connection and Isolation Panel [NHANES]    Frequency of Communication with Friends and Family: More than three times a week    Frequency of Social Gatherings with Friends and Family: Once a week    Attends Religious Services: More than 4 times per year    Active Member of [employer]  or Organizations: Yes    Attends Engineer, structural: More than 4 times per year    Marital Status: Married    Family History  Problem Relation Age of Onset   Hypertension Other    Heart attack Other    Stroke Other    Alzheimer's disease Mother    Stroke Father     Health Maintenance  Topic Date Due   HIV Screening  Never done   Hepatitis C Screening  Never done   COVID-19 Vaccine (3 - 2024-25 season) 01/14/2023   INFLUENZA VACCINE  08/13/2023 (Originally 12/14/2022)   Colonoscopy  12/15/2027   DTaP/Tdap/Td (3 - Td or Tdap) 03/15/2032   Zoster Vaccines- Shingrix  Completed   Pneumococcal Vaccine 44-60 Years old  Aged Out   HPV VACCINES  Aged Out      ----------------------------------------------------------------------------------------------------------------------------------------------------------------------------------------------------------------- Physical Exam BP 126/78   Pulse (!) 56   Ht 5\' 11"  (1.803 m)   Wt 203 lb (92.1 kg)   SpO2 97%   BMI 28.31 kg/m   Physical Exam Constitutional:      Appearance: Normal appearance.  Eyes:     General: No scleral icterus. Cardiovascular:     Rate and Rhythm: Normal rate and regular rhythm.     Heart sounds: Normal heart sounds.  Pulmonary:     Effort: Pulmonary effort is normal.     Breath sounds: Normal breath sounds.  Musculoskeletal:     Cervical back: Neck supple.  Neurological:     Mental Status: He is alert.  Psychiatric:        Mood and Affect: Mood normal.        Behavior: Behavior normal.    EKG: Sinus bradycardia.  Normal PR and Qtc intervals.  ------------------------------------------------------------------------------------------------------------------------------------------------------------------------------------------------------------------- Assessment and Plan  Essential hypertension Blood pressure remains fairly well-controlled.  I recommend that he continue valsartan and chlorthalidone at current strength.  Family history of sudden cardiac death in son Cardiomegaly noted on autopsy.  EKG completed today.  Echocardiogram ordered.  Cardiology referral entered.     No orders of the defined types were placed in this encounter.   No follow-ups on file.    This visit occurred during the SARS-CoV-2 public health emergency.  Safety protocols were in place, including screening questions prior to the visit, additional usage of staff PPE, and extensive cleaning of exam room while observing appropriate contact time as indicated for disinfecting solutions.

## 2023-06-04 NOTE — Assessment & Plan Note (Addendum)
Cardiomegaly noted on autopsy.  EKG completed today which is unremarkable..  Echocardiogram ordered.  Cardiology referral entered.

## 2023-06-04 NOTE — Assessment & Plan Note (Signed)
Blood pressure remains fairly well-controlled.  I recommend that he continue valsartan and chlorthalidone at current strength.

## 2023-06-05 ENCOUNTER — Encounter: Payer: Self-pay | Admitting: Family Medicine

## 2023-06-15 ENCOUNTER — Other Ambulatory Visit: Payer: Self-pay | Admitting: Family Medicine

## 2023-06-25 ENCOUNTER — Other Ambulatory Visit: Payer: Self-pay | Admitting: Family Medicine

## 2023-06-25 ENCOUNTER — Ambulatory Visit (HOSPITAL_COMMUNITY): Payer: BC Managed Care – PPO | Attending: Cardiovascular Disease

## 2023-06-25 DIAGNOSIS — Z8249 Family history of ischemic heart disease and other diseases of the circulatory system: Secondary | ICD-10-CM | POA: Insufficient documentation

## 2023-06-25 DIAGNOSIS — Z8241 Family history of sudden cardiac death: Secondary | ICD-10-CM | POA: Insufficient documentation

## 2023-06-25 DIAGNOSIS — I517 Cardiomegaly: Secondary | ICD-10-CM | POA: Diagnosis not present

## 2023-06-25 LAB — ECHOCARDIOGRAM COMPLETE
Area-P 1/2: 4.99 cm2
S' Lateral: 2.8 cm

## 2023-06-26 ENCOUNTER — Encounter: Payer: Self-pay | Admitting: Family Medicine

## 2023-06-29 ENCOUNTER — Other Ambulatory Visit: Payer: Self-pay | Admitting: Family Medicine

## 2023-06-29 DIAGNOSIS — M545 Low back pain, unspecified: Secondary | ICD-10-CM

## 2023-07-19 NOTE — Progress Notes (Addendum)
 Referring-Cody Jerolyn Center, DO Reason for referral-family history of sudden cardiac death  HPI: 63 year old male for evaluation of family history of sudden cardiac death at request of Milford Cage, DO.  Laboratories October 2024 showed normal TSH, hemoglobin 14.5, creatinine 0.96, potassium 3.9, total cholesterol 196 with LDL 128.  Echocardiogram February 2025 showed normal LV function, mild left ventricular hypertrophy, mild left atrial enlargement, mildly dilated ascending aorta at 41 mm.  Patient's 51 year old son had sudden cardiac death in 08-14-2021.  By report the autopsy showed cardiomegaly with left ventricular hypertrophy but not consistent with hypertrophic cardiomyopathy; coronaries were normal.  Cardiology now asked to evaluate.  Patient has dyspnea with more vigorous activities but not routine activities.  No orthopnea, PND, pedal edema, exertional chest pain, palpitations he has no history of syncope.  Current Outpatient Medications  Medication Sig Dispense Refill   aspirin EC 81 MG tablet Take 81 mg by mouth daily.     buPROPion (WELLBUTRIN XL) 300 MG 24 hr tablet Take 1 tablet (300 mg total) by mouth daily. 90 tablet 3   chlorthalidone (HYGROTON) 25 MG tablet Take 1 tablet (25 mg total) by mouth daily. 90 tablet 1   Doxepin HCl 3 MG TABS Take 1 tablet (3 mg total) by mouth at bedtime. 90 tablet 1   gabapentin (NEURONTIN) 300 MG capsule TAKE 1 CAPSULE BY MOUTH EVERY NIGHT AT BEDTIME FOR 7 DAYS , MAY INCREASE TO THREE TIMES A DAY IF TOLERATING WELL 90 capsule 3   HYDROcodone-acetaminophen (NORCO/VICODIN) 5-325 MG tablet Take 1 tablet by mouth every 6 (six) hours as needed for moderate pain (pain score 4-6) or severe pain (pain score 7-10). 30 tablet 0   levothyroxine (SYNTHROID) 125 MCG tablet Take 1 tablet (125 mcg total) by mouth daily before breakfast. 90 tablet 3   meloxicam (MOBIC) 15 MG tablet TAKE 1/2 TABLET BY MOUTH TWICE A DAY 60 tablet 1   omeprazole (PRILOSEC) 40 MG capsule  TAKE 1 CAPSULE BY MOUTH DAILY 90 capsule 3   valsartan (DIOVAN) 160 MG tablet TAKE 1 TABLET BY MOUTH DAILY 90 tablet 1   No current facility-administered medications for this visit.    Allergies  Allergen Reactions   Amlodipine    Buspar [Buspirone]      Past Medical History:  Diagnosis Date   Hypertension    Low thyroid stimulating hormone (TSH) level     Past Surgical History:  Procedure Laterality Date   fractured bone     JOINT REPLACEMENT     SPINAL FUSION      Social History   Socioeconomic History   Marital status: Married    Spouse name: Not on file   Number of children: Not on file   Years of education: Not on file   Highest education level: Associate degree: occupational, Scientist, product/process development, or vocational program  Occupational History   Occupation: Building services engineer  Tobacco Use   Smoking status: Former    Current packs/day: 0.00    Types: Cigarettes    Quit date: 05/2011    Years since quitting: 12.1   Smokeless tobacco: Never  Vaping Use   Vaping status: Never Used  Substance and Sexual Activity   Alcohol use: Yes    Alcohol/week: 2.0 standard drinks of alcohol    Types: 2 Standard drinks or equivalent per week   Drug use: Never   Sexual activity: Yes    Partners: Female  Other Topics Concern   Not on file  Social History Narrative  Not on file   Social Drivers of Health   Financial Resource Strain: Low Risk  (06/03/2023)   Overall Financial Resource Strain (CARDIA)    Difficulty of Paying Living Expenses: Not hard at all  Food Insecurity: No Food Insecurity (06/03/2023)   Hunger Vital Sign    Worried About Running Out of Food in the Last Year: Never true    Ran Out of Food in the Last Year: Never true  Transportation Needs: No Transportation Needs (06/03/2023)   PRAPARE - Administrator, Civil Service (Medical): No    Lack of Transportation (Non-Medical): No  Physical Activity: Insufficiently Active (06/03/2023)   Exercise Vital Sign     Days of Exercise per Week: 3 days    Minutes of Exercise per Session: 20 min  Stress: No Stress Concern Present (06/03/2023)   [name] of Occupational Health - Occupational Stress Questionnaire    Feeling of Stress : Only a little  Social Connections: Socially Integrated (06/03/2023)   Social Connection and Isolation Panel [NHANES]    Frequency of Communication with Friends and Family: More than three times a week    Frequency of Social Gatherings with Friends and Family: Once a week    Attends Religious Services: More than 4 times per year    Active Member of [employer] or Organizations: Yes    Attends Engineer, structural: More than 4 times per year    Marital Status: Married  Catering manager Violence: Unknown (08/17/2021)   Received from [employer]   HITS    Physically Hurt: Not on file    Insult or Talk Down To: Not on file    Threaten Physical Harm: Not on file    Scream or Curse: Not on file    Family History  Problem Relation Age of Onset   Hypertension Other    Heart attack Other    Stroke Other    Alzheimer's disease Mother    Stroke Father     ROS: no fevers or chills, productive cough, hemoptysis, dysphasia, odynophagia, melena, hematochezia, dysuria, hematuria, rash, seizure activity, orthopnea, PND, pedal edema, claudication. Remaining systems are negative.  Physical Exam:   There were no vitals taken for this visit.  General:  Well developed/well nourished in NAD Skin warm/dry Patient not depressed No peripheral clubbing Back-normal HEENT-normal/normal eyelids Neck supple/normal carotid upstroke bilaterally; soft left carotid bruit; no JVD; no thyromegaly chest - CTA/ normal expansion CV - RRR/normal S1 and S2; no murmurs, rubs or gallops;  PMI nondisplaced Abdomen -NT/ND, no HSM, no mass, + bowel sounds, no bruit 2+ femoral pulses, no bruits Ext-no edema, chords, 2+ DP Neuro-grossly nonfocal  ECG -June 04, 2023-sinus bradycardia, no ST changes, normal QT interval.  Personally reviewed  A/P  1 family history of sudden cardiac death-patient also complains of increased dyspnea on exertion.  Will arrange coronary CTA to rule out obstructive coronary disease as he does have a family history of coronary disease in his father.  Can also consider genetic testing in the future if other family members identified to have cardiac issues.  Note he denies any other family history of sudden cardiac death outside of his son. I will obtain the final copy of his sons autopsy results and make further recommendations once reviewed.  Also note his electrocardiogram is normal with no evidence of QT prolongation or other abnormalities.  2 hypertension-blood pressure is borderline.  Continue present medications and follow.  Increase as needed.  3 hyperlipidemia-if coronary disease demonstrated he will need statin therapy.  4 Dilated aortic root-mild on recent echocardiogram.  Will plan repeat study in 1 year.  5 carotid bruit-schedule carotid Dopplers to further assess.  I have obtained a copy of the autopsy on the patient's phone.  Patient apparently had witnessed arrest syncope and was found to be in ventricular fibrillation at time of EMS arrival.  Attempts at resuscitation were unsuccessful.  Diagnosis: 1 Cardiomegaly with associated left ventricular hypertrophy (1.4 cm) without evidence of hypertrophic cardiomyopathy.  2 Remote myocardial infarction of the posterior left ventricle and interventricular septum.   3 No coronary artery disease, infiltrative/infectious/inflammatory process noted.   4 No trauma noted.   5 Toxicology negative for common medications, illicit drugs of abuse or ethanol.  Further description under summary states patient had left ventricular hypertrophy without features of hypertrophic cardiomyopathy.  Heart also showed an area of remote myocardial infarction without evidence of coronary artery  disease, infiltrative processes or infectious/inflammatory processes.  Findings could represent myocardial infarction with nonobstructive coronary arteries such as coronary vasospasm or microvascular dysfunction, healed myocarditis, nonischemic cardiomyopathy (including genetic causes) or other secondary causes of cardiomegaly and myocardial infarction (i.e. medications/toxins, systemic disease processes, hypertension, arrhythmia, idiopathic, etc.) cannot be excluded by autopsy findings alone.  Reviewed with Dr. Graciela Husbands.  Given findings on his son's autopsy including prior infarct we will arrange cardiac MRI to rule out infiltrative disease.  He did not think pursuing Holter monitor/Zio patch was necessary.  He also did not feel like further workup for channel apathy's was indicated based on the findings of his son's autopsy.  Will arrange cardiac MRI.    Olga Millers, MD

## 2023-08-01 ENCOUNTER — Ambulatory Visit: Payer: BC Managed Care – PPO | Attending: Cardiology | Admitting: Cardiology

## 2023-08-01 ENCOUNTER — Encounter: Payer: Self-pay | Admitting: Cardiology

## 2023-08-01 VITALS — BP 140/82 | HR 64 | Ht 71.0 in | Wt 204.1 lb

## 2023-08-01 DIAGNOSIS — R0989 Other specified symptoms and signs involving the circulatory and respiratory systems: Secondary | ICD-10-CM

## 2023-08-01 DIAGNOSIS — I10 Essential (primary) hypertension: Secondary | ICD-10-CM | POA: Diagnosis not present

## 2023-08-01 DIAGNOSIS — R0609 Other forms of dyspnea: Secondary | ICD-10-CM

## 2023-08-01 DIAGNOSIS — E7800 Pure hypercholesterolemia, unspecified: Secondary | ICD-10-CM

## 2023-08-01 MED ORDER — METOPROLOL TARTRATE 100 MG PO TABS: ORAL_TABLET | ORAL | 0 refills | Status: AC

## 2023-08-01 NOTE — Patient Instructions (Signed)
    Testing/Procedures:  US CAROTID ARTERIES AT [facility] 1 ST FLOOR IMAGING DEPARTMENT     Your cardiac CT will be scheduled at    [facility] [address] [place], [place] [address] [phone]   If scheduled at [facility], please arrive 30 minutes early for check-in and test prep.  Please follow these instructions carefully (unless otherwise directed):  An IV will be required for this test and Nitroglycerin will be given.  Hold all erectile dysfunction medications at least 3 days (72 hrs) prior to test. (Ie viagra, cialis, sildenafil, tadalafil, etc)   On the Night Before the Test: Be sure to Drink plenty of water. Do not consume any caffeinated/decaffeinated beverages or chocolate 12 hours prior to your test. Do not take any antihistamines 12 hours prior to your test.   On the Day of the Test: Drink plenty of water until 1 hour prior to the test. Do not eat any food 1 hour prior to test. You may take your regular medications prior to the test.  Take metoprolol (Lopressor) 100 MG two hours prior to test.      After the Test: Drink plenty of water. After receiving IV contrast, you may experience a mild flushed feeling. This is normal. On occasion, you may experience a mild rash up to 24 hours after the test. This is not dangerous. If this occurs, you can take Benadryl 25 mg, Zyrtec, Claritin, or Allegra and increase your fluid intake. (Patients taking Tikosyn should avoid Benadryl, and may take Zyrtec, Claritin, or Allegra) If you experience trouble breathing, this can be serious. If it is severe call [phone] IMMEDIATELY. If it is mild, please call our office.  We will call to schedule your test 2-4 weeks out understanding that some insurance companies will need an authorization prior to the service being performed.   For more information and frequently asked questions, please visit our website :  [email or web address]  For non-scheduling related questions, please contact the cardiac imaging nurse navigator should you have any questions/concerns: Cardiac Imaging Nurse Navigators Direct Office Dial: [phone]   For scheduling needs, including cancellations and rescheduling, please call [place], [phone].    Follow-Up: At [facility], you and your health needs are our priority.  As part of our continuing mission to provide you with exceptional heart care, we have created designated Provider Care Teams.  These Care Teams include your primary Cardiologist (physician) and Advanced Practice Providers (APPs -  Physician Assistants and Nurse Practitioners) who all work together to provide you with the care you need, when you need it.    Your next appointment:   12 month(s)  Provider:   Olga Millers, MD

## 2023-08-01 NOTE — Telephone Encounter (Signed)
 Called patient. Verified full name and DOB. Patient is unable to make it to tomorrow's appointment for his US Carotid Duplex due to having other appointments at the same time. Wants to cancel. Pt also wants to know if study can be done at [facility] imaging at the [facility] [place]. I informed patient studies are done at select locations. Patient wants a call back to reschedule. Notified pt I would forward call to scheduling and Dr. Jens Som. Patient verbalized understanding.  Josie LPN

## 2023-08-02 ENCOUNTER — Ambulatory Visit (HOSPITAL_BASED_OUTPATIENT_CLINIC_OR_DEPARTMENT_OTHER)

## 2023-08-03 ENCOUNTER — Encounter: Payer: Self-pay | Admitting: *Deleted

## 2023-08-03 ENCOUNTER — Telehealth: Payer: Self-pay | Admitting: *Deleted

## 2023-08-03 DIAGNOSIS — R0609 Other forms of dyspnea: Secondary | ICD-10-CM

## 2023-08-03 NOTE — Telephone Encounter (Signed)
 Spoke with pt, per dr Jens Som, he would like to do a cardiac MRI to further evaluate the heart muscle. Order placed, instructions sent to patient via my chart. The patient will also need cardiac CTA as ordered. The patient will need something for anxiety for the MRI, will forward to dr Jens Som.

## 2023-08-06 ENCOUNTER — Other Ambulatory Visit: Payer: Self-pay | Admitting: Family Medicine

## 2023-08-06 ENCOUNTER — Ambulatory Visit

## 2023-08-06 DIAGNOSIS — R0989 Other specified symptoms and signs involving the circulatory and respiratory systems: Secondary | ICD-10-CM | POA: Diagnosis not present

## 2023-08-06 NOTE — Telephone Encounter (Signed)
 Requesting rx rf of  Hydro acet 5-325 Last written 04/25/2023 Last OV 06/04/2023 Upcoming appt 08/20/2023

## 2023-08-10 MED ORDER — HYDROCODONE-ACETAMINOPHEN 5-325 MG PO TABS: 1.0000 | ORAL_TABLET | Freq: Four times a day (QID) | ORAL | 0 refills | Status: DC | PRN

## 2023-08-20 ENCOUNTER — Ambulatory Visit: Payer: BC Managed Care – PPO | Admitting: Family Medicine

## 2023-09-03 ENCOUNTER — Encounter: Payer: Self-pay | Admitting: Family Medicine

## 2023-09-03 ENCOUNTER — Ambulatory Visit: Admitting: Family Medicine

## 2023-09-03 VITALS — BP 120/76 | HR 57 | Ht 71.0 in | Wt 203.0 lb

## 2023-09-03 DIAGNOSIS — E039 Hypothyroidism, unspecified: Secondary | ICD-10-CM

## 2023-09-03 DIAGNOSIS — M48061 Spinal stenosis, lumbar region without neurogenic claudication: Secondary | ICD-10-CM

## 2023-09-03 DIAGNOSIS — E785 Hyperlipidemia, unspecified: Secondary | ICD-10-CM

## 2023-09-03 DIAGNOSIS — I10 Essential (primary) hypertension: Secondary | ICD-10-CM

## 2023-09-03 NOTE — Progress Notes (Signed)
 Brian Russell - 63 y.o. male MRN [id number]  Date of birth: 1960/05/22  Subjective Chief Complaint  Patient presents with   Hypertension    HPI Brian Russell is a 63 y.o. male here today for follow up visit.   He reports that he is doing pretty well.   BP is well controlled with current medications.  He is interested in trying to come off of these at some point.  He denies symptoms related to HTN including chest pain, shortness of breath, palpitations, headache or vision changes.  He has upcoming MRI/CT of the heart next week due to family history of sudden cardiac death in his sone.   Continues on mobic  and gabapentin  for radicular neck and back pain.  Occasional norco as needed if having severe pain.  He is using 30 tabs about every 90-120 days.    ROS:  A comprehensive ROS was completed and negative except as noted per HPI    Allergies  Allergen Reactions   Amlodipine    Buspar [Buspirone]     Past Medical History:  Diagnosis Date   Hypertension    Low thyroid  stimulating hormone (TSH) level     Past Surgical History:  Procedure Laterality Date   fractured bone     JOINT REPLACEMENT     SPINAL FUSION      Social History   Socioeconomic History   Marital status: Married    Spouse name: Not on file   Number of children: Not on file   Years of education: Not on file   Highest education level: Associate degree: occupational, Scientist, product/process development, or vocational program  Occupational History   Occupation: Building services engineer  Tobacco Use   Smoking status: Former    Current packs/day: 0.00    Types: Cigarettes    Quit date: 05/2011    Years since quitting: 12.3   Smokeless tobacco: Never  Vaping Use   Vaping status: Never Used  Substance and Sexual Activity   Alcohol use: Yes    Alcohol/week: 2.0 standard drinks of alcohol    Types: 2 Standard drinks or equivalent per week   Drug use: Never   Sexual activity: Yes    Partners: Female  Other Topics Concern   Not  on file  Social History Narrative   Not on file   Social Drivers of Health   Financial Resource Strain: Low Risk  (06/03/2023)   Overall Financial Resource Strain (CARDIA)    Difficulty of Paying Living Expenses: Not hard at all  Food Insecurity: No Food Insecurity (06/03/2023)   Hunger Vital Sign    Worried About Running Out of Food in the Last Year: Never true    Ran Out of Food in the Last Year: Never true  Transportation Needs: No Transportation Needs (06/03/2023)   PRAPARE - Administrator, Civil Service (Medical): No    Lack of Transportation (Non-Medical): No  Physical Activity: Insufficiently Active (06/03/2023)   Exercise Vital Sign    Days of Exercise per Week: 3 days    Minutes of Exercise per Session: 20 min  Stress: No Stress Concern Present (06/03/2023)   [name] of Occupational Health - Occupational Stress Questionnaire    Feeling of Stress : Only a little  Social Connections: Socially Integrated (06/03/2023)   Social Connection and Isolation Panel [NHANES]    Frequency of Communication with Friends and Family: More than three times a week    Frequency of Social Gatherings with Friends and Family:  Once a week    Attends Religious Services: More than 4 times per year    Active Member of Clubs or Organizations: Yes    Attends Banker Meetings: More than 4 times per year    Marital Status: Married    Family History  Problem Relation Age of Onset   Hypertension Other    Heart attack Other    Stroke Other    Alzheimer's disease Mother    Stroke Father     Health Maintenance  Topic Date Due   HIV Screening  Never done   Hepatitis C Screening  Never done   COVID-19 Vaccine (3 - 2024-25 season) 01/14/2023   INFLUENZA VACCINE  12/14/2023   Colonoscopy  12/15/2027   DTaP/Tdap/Td (3 - Td or Tdap) 03/15/2032   Zoster Vaccines- Shingrix   Completed   Pneumococcal Vaccine 40-19 Years old  Aged Out   HPV VACCINES  Aged Out    Meningococcal B Vaccine  Aged Out     ----------------------------------------------------------------------------------------------------------------------------------------------------------------------------------------------------------------- Physical Exam BP 120/76   Pulse (!) 57   Ht 5\' 11"  (1.803 m)   Wt 203 lb (92.1 kg)   SpO2 97%   BMI 28.31 kg/m   Physical Exam Constitutional:      Appearance: Normal appearance.  Eyes:     General: No scleral icterus. Cardiovascular:     Rate and Rhythm: Normal rate and regular rhythm.  Pulmonary:     Effort: Pulmonary effort is normal.     Breath sounds: Normal breath sounds.  Neurological:     Mental Status: He is alert.  Psychiatric:        Mood and Affect: Mood normal.        Behavior: Behavior normal.     ------------------------------------------------------------------------------------------------------------------------------------------------------------------------------------------------------------------- Assessment and Plan  Hypothyroidism Feels good at current dose of levothyroxine .  Last TSH within normal limits.  HLD (hyperlipidemia)  Tolerating atorvastatin  at current strength.   Essential hypertension Blood pressure remains fairly well-controlled.  I recommend that he continue valsartan  and chlorthalidone  at current strength.  Lumbar foraminal stenosis Continue daily meloxicam  and gabapentin .  Using hydrocodone  as needed for back pain.  No signs of abuse or overuse. PDMP reviewed.     No orders of the defined types were placed in this encounter.   Return in about 6 months (around 03/04/2024) for Hypertension, fasting labs.

## 2023-09-03 NOTE — Assessment & Plan Note (Signed)
 Continue daily meloxicam  and gabapentin .  Using hydrocodone  as needed for back pain.  No signs of abuse or overuse. PDMP reviewed.

## 2023-09-03 NOTE — Assessment & Plan Note (Signed)
Feels good at current dose of levothyroxine.  Last TSH within normal limits.

## 2023-09-03 NOTE — Assessment & Plan Note (Signed)
Blood pressure remains fairly well-controlled.  I recommend that he continue valsartan and chlorthalidone at current strength.

## 2023-09-03 NOTE — Assessment & Plan Note (Signed)
  Tolerating atorvastatin  at current strength.

## 2023-09-06 ENCOUNTER — Encounter (HOSPITAL_COMMUNITY): Payer: Self-pay

## 2023-09-10 ENCOUNTER — Ambulatory Visit (HOSPITAL_COMMUNITY): Admission: RE | Admit: 2023-09-10 | Source: Ambulatory Visit

## 2023-09-11 ENCOUNTER — Other Ambulatory Visit (HOSPITAL_BASED_OUTPATIENT_CLINIC_OR_DEPARTMENT_OTHER)

## 2023-09-11 ENCOUNTER — Ambulatory Visit (HOSPITAL_BASED_OUTPATIENT_CLINIC_OR_DEPARTMENT_OTHER)

## 2023-09-17 ENCOUNTER — Telehealth (HOSPITAL_COMMUNITY): Payer: Self-pay | Admitting: Emergency Medicine

## 2023-09-17 NOTE — Telephone Encounter (Signed)
 Reaching out to patient to offer assistance regarding upcoming cardiac imaging study; pt verbalizes understanding of appt date/time, parking situation and where to check in, pre-test NPO status and medications ordered, and verified current allergies; name and call back number provided for further questions should they arise Rockwell Alexandria RN Navigator Cardiac Imaging [facility] [phone] office [phone] cell

## 2023-09-18 ENCOUNTER — Encounter (HOSPITAL_BASED_OUTPATIENT_CLINIC_OR_DEPARTMENT_OTHER): Payer: Self-pay

## 2023-09-18 ENCOUNTER — Ambulatory Visit (HOSPITAL_BASED_OUTPATIENT_CLINIC_OR_DEPARTMENT_OTHER)
Admission: RE | Admit: 2023-09-18 | Discharge: 2023-09-18 | Disposition: A | Discharge status: Home / Self Care | Source: Ambulatory Visit | Attending: Cardiology | Admitting: Cardiology

## 2023-09-18 DIAGNOSIS — R931 Abnormal findings on diagnostic imaging of heart and coronary circulation: Secondary | ICD-10-CM | POA: Diagnosis not present

## 2023-09-18 DIAGNOSIS — R0609 Other forms of dyspnea: Secondary | ICD-10-CM | POA: Insufficient documentation

## 2023-09-18 MED ORDER — IOHEXOL 350 MG/ML SOLN
100.0000 mL | Freq: Once | INTRAVENOUS | Status: AC | PRN
Start: 2023-09-18 — End: 2023-09-18
  Administered 2023-09-18: 95 mL via INTRAVENOUS

## 2023-09-18 MED ORDER — NITROGLYCERIN 0.4 MG SL SUBL
0.8000 mg | SUBLINGUAL_TABLET | Freq: Once | SUBLINGUAL | Status: AC
  Administered 2023-09-18: 0.8 mg via SUBLINGUAL

## 2023-09-20 ENCOUNTER — Ambulatory Visit (HOSPITAL_BASED_OUTPATIENT_CLINIC_OR_DEPARTMENT_OTHER)
Admission: RE | Admit: 2023-09-20 | Discharge: 2023-09-20 | Disposition: A | Discharge status: Home / Self Care | Source: Ambulatory Visit | Attending: Cardiology | Admitting: Cardiology

## 2023-09-20 ENCOUNTER — Other Ambulatory Visit: Payer: Self-pay | Admitting: Cardiology

## 2023-09-20 DIAGNOSIS — R0609 Other forms of dyspnea: Secondary | ICD-10-CM | POA: Diagnosis not present

## 2023-09-20 DIAGNOSIS — R931 Abnormal findings on diagnostic imaging of heart and coronary circulation: Secondary | ICD-10-CM

## 2023-09-20 NOTE — Progress Notes (Signed)
FFR Orders 

## 2023-09-24 ENCOUNTER — Telehealth: Payer: Self-pay | Admitting: *Deleted

## 2023-09-24 DIAGNOSIS — E7800 Pure hypercholesterolemia, unspecified: Secondary | ICD-10-CM

## 2023-09-24 MED ORDER — ROSUVASTATIN CALCIUM 40 MG PO TABS: 40.0000 mg | ORAL_TABLET | Freq: Every day | ORAL | 3 refills | Status: AC

## 2023-09-24 NOTE — Telephone Encounter (Signed)
 Patient aware of the CT results and recommendations.  New script sent to the pharmacy  Lab orders mailed to the pt

## 2023-09-24 NOTE — Telephone Encounter (Signed)
-----   Message from Brian Russell sent at 09/21/2023  6:32 AM EDT ----- Moderate disease; await FFR; continue ASA; add crestor 40 mg daily; lipids and liver 8 weeks Brian Russell

## 2023-09-25 ENCOUNTER — Ambulatory Visit: Payer: Self-pay | Admitting: Emergency Medicine

## 2023-09-30 ENCOUNTER — Ambulatory Visit: Payer: Self-pay | Admitting: Cardiology

## 2023-10-11 ENCOUNTER — Ambulatory Visit: Admitting: Family Medicine

## 2023-10-11 ENCOUNTER — Encounter: Payer: Self-pay | Admitting: Family Medicine

## 2023-10-11 VITALS — BP 143/83 | HR 53 | Resp 18 | Ht 71.0 in | Wt 201.5 lb

## 2023-10-11 DIAGNOSIS — G8929 Other chronic pain: Secondary | ICD-10-CM | POA: Diagnosis not present

## 2023-10-11 DIAGNOSIS — H9313 Tinnitus, bilateral: Secondary | ICD-10-CM

## 2023-10-11 DIAGNOSIS — M545 Low back pain, unspecified: Secondary | ICD-10-CM

## 2023-10-11 MED ORDER — PREDNISONE 10 MG (21) PO TBPK: ORAL_TABLET | ORAL | 0 refills | Status: DC

## 2023-10-11 NOTE — Assessment & Plan Note (Signed)
 Adding course of prednisone  and will place referral back to physical therapy.  Consider MRI if not improving.

## 2023-10-11 NOTE — Progress Notes (Signed)
 Brian Russell - 63 y.o. male MRN [id number]  Date of birth: 09-02-1960  Subjective Chief Complaint  Patient presents with   Medical Management of Chronic Issues    Low back pain and ringing and pain has gotten worse    HPI Brian Russell is a 63 y.o. male here today to for follow-up of low back and hip pain.  Has had some worsening over the past couple weeks.  Some radiation into the buttock area.  Denies numbness or tingling.  No recent injury or overuse.  He does have Norco and uses this very sparingly.  Also has complaint of tinnitus.  This is chronic in nature but worsened past few months.  He does have some hearing loss associated with this.  Would like to have referral to ENT  ROS:  A comprehensive ROS was completed and negative except as noted per HPI  Allergies  Allergen Reactions   Amlodipine    Buspar [Buspirone]     Past Medical History:  Diagnosis Date   Hypertension    Low thyroid  stimulating hormone (TSH) level     Past Surgical History:  Procedure Laterality Date   fractured bone     JOINT REPLACEMENT     SPINAL FUSION      Social History   Socioeconomic History   Marital status: Married    Spouse name: Not on file   Number of children: Not on file   Years of education: Not on file   Highest education level: Associate degree: occupational, Scientist, product/process development, or vocational program  Occupational History   Occupation: Building services engineer  Tobacco Use   Smoking status: Former    Current packs/day: 0.00    Types: Cigarettes    Quit date: 05/2011    Years since quitting: 12.4   Smokeless tobacco: Never  Vaping Use   Vaping status: Never Used  Substance and Sexual Activity   Alcohol use: Yes    Alcohol/week: 2.0 standard drinks of alcohol    Types: 2 Standard drinks or equivalent per week   Drug use: Never   Sexual activity: Yes    Partners: Female  Other Topics Concern   Not on file  Social History Narrative   Not on file   Social Drivers of  Health   Financial Resource Strain: Low Risk  (06/03/2023)   Overall Financial Resource Strain (CARDIA)    Difficulty of Paying Living Expenses: Not hard at all  Food Insecurity: No Food Insecurity (06/03/2023)   Hunger Vital Sign    Worried About Running Out of Food in the Last Year: Never true    Ran Out of Food in the Last Year: Never true  Transportation Needs: No Transportation Needs (06/03/2023)   PRAPARE - Administrator, Civil Service (Medical): No    Lack of Transportation (Non-Medical): No  Physical Activity: Insufficiently Active (06/03/2023)   Exercise Vital Sign    Days of Exercise per Week: 3 days    Minutes of Exercise per Session: 20 min  Stress: No Stress Concern Present (06/03/2023)   [name] of Occupational Health - Occupational Stress Questionnaire    Feeling of Stress : Only a little  Social Connections: Socially Integrated (06/03/2023)   Social Connection and Isolation Panel [NHANES]    Frequency of Communication with Friends and Family: More than three times a week    Frequency of Social Gatherings with Friends and Family: Once a week    Attends Religious Services: More than 4 times  per year    Active Member of Clubs or Organizations: Yes    Attends Banker Meetings: More than 4 times per year    Marital Status: Married    Family History  Problem Relation Age of Onset   Hypertension Other    Heart attack Other    Stroke Other    Alzheimer's disease Mother    Stroke Father     Health Maintenance  Topic Date Due   HIV Screening  Never done   Hepatitis C Screening  Never done   COVID-19 Vaccine (3 - 2024-25 season) 01/14/2023   INFLUENZA VACCINE  12/14/2023   Colonoscopy  12/15/2027   DTaP/Tdap/Td (3 - Td or Tdap) 03/15/2032   Zoster Vaccines- Shingrix   Completed   Pneumococcal Vaccine 29-34 Years old  Aged Out   HPV VACCINES  Aged Out   Meningococcal B Vaccine  Aged Out      ----------------------------------------------------------------------------------------------------------------------------------------------------------------------------------------------------------------- Physical Exam BP (!) 143/83 (Cuff Size: Large)   Pulse (!) 53   Resp 18   Ht 5\' 11"  (1.803 m)   Wt 201 lb 8 oz (91.4 kg)   SpO2 97%   BMI 28.10 kg/m   Physical Exam Constitutional:      Appearance: Normal appearance.  HENT:     Right Ear: Tympanic membrane and ear canal normal.     Left Ear: Tympanic membrane and ear canal normal.  Eyes:     General: No scleral icterus. Cardiovascular:     Rate and Rhythm: Normal rate and regular rhythm.  Pulmonary:     Effort: Pulmonary effort is normal.     Breath sounds: Normal breath sounds.  Musculoskeletal:     Cervical back: Neck supple.     Comments: Range of motion of the lumbar spine is normal with some pain on flexion.  Neurological:     Mental Status: He is alert.  Psychiatric:        Mood and Affect: Mood normal.        Behavior: Behavior normal.     ------------------------------------------------------------------------------------------------------------------------------------------------------------------------------------------------------------------- Assessment and Plan  Chronic low back pain Adding course of prednisone  and will place referral back to physical therapy.  Consider MRI if not improving.  Tinnitus of both ears ENT referral placed.   Meds ordered this encounter  Medications   predniSONE  (STERAPRED UNI-PAK 21 TAB) 10 MG (21) TBPK tablet    Sig: Taper as directed on packaging.    Dispense:  21 tablet    Refill:  0    No follow-ups on file.

## 2023-10-11 NOTE — Assessment & Plan Note (Signed)
 ENT referral placed.

## 2023-10-15 NOTE — Therapy (Signed)
 OUTPATIENT PHYSICAL THERAPY THORACOLUMBAR EVALUATION   Patient Name: Brian Russell MRN: [id number] DOB:May 09, 1961, 63 y.o., male Today's Date: 10/16/2023  END OF SESSION:  PT End of Session - 10/16/23 1725     Visit Number 1    Number of Visits 16    Date for PT Re-Evaluation 12/11/23    Authorization Type [employer] of [place] auth required [employer]    PT Start Time 1615    PT Stop Time 1705    PT Time Calculation (min) 50 min             Past Medical History:  Diagnosis Date   Hypertension    Low thyroid  stimulating hormone (TSH) level    Past Surgical History:  Procedure Laterality Date   fractured bone     JOINT REPLACEMENT     SPINAL FUSION     Patient Active Problem List   Diagnosis Date Noted   Tinnitus of both ears 10/11/2023   Family history of sudden cardiac death in son 06/18/2023   Acute sinusitis 04/11/2022   Grief at loss of child 01/19/2022   Watering of eye 11/01/2021   Insomnia 09/12/2021   Vertigo 01/30/2021   Lumbar foraminal stenosis 08/31/2020   Bilateral shoulder pain 06/17/2020   Low back pain with sciatica 06/17/2020   Eustachian tube dysfunction 01/06/2020   Cervical spondylosis 12/10/2019   Essential hypertension 10/15/2019   Hypothyroidism 10/15/2019   HLD (hyperlipidemia) 10/15/2019   Chronic low back pain 10/15/2019   Tinea pedis 10/15/2019   Well adult exam 10/15/2019   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   Neck pain 09/25/2019    PCP: Dr Adela Holter  REFERRING PROVIDER: Dr Adela Holter  REFERRING DIAG: Chronic R sided LBP   Rationale for Evaluation and Treatment: Rehabilitation  THERAPY DIAG:  Other low back pain  Other symptoms and signs involving the musculoskeletal system  Muscle weakness (generalized)  ONSET DATE: 09/19/23  SUBJECTIVE:                                                                                                                                                                                            SUBJECTIVE STATEMENT: Patient has a history of chronic bilat LBP and pain into the coccyx and bilat buttock for the past 30 years. Symptoms have been worsening over the past few weeks with no known cause. Symptoms come and go and he is not sure what irritates the symptoms    PERTINENT HISTORY:  HTN; low TSH; fx L clavicle and L elbow; bilat knee scopes for meniscus injuries ~ 15 yrs ago; laminectomy with spinal fusion L4/5 ~  2013 with no significant improvement  PAIN:  Are you having pain? Yes: NPRS scale: 3/10; worst in the past week 8/10 Pain location: LB to coccyx to bilat buttocks  Pain description: throbbing; sometimes sharp  Aggravating factors: heavy lifting otherwise unknown cause  Relieving factors: rest; use of back brace for work in the yard   PRECAUTIONS: None  RED FLAGS: None   WEIGHT BEARING RESTRICTIONS: No  FALLS:  Has patient fallen in last 6 months? 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  DIAGNOSTIC FINDINGS:  MRI 10/30/20: IMPRESSION: 1. Laminectomy and fusion at L4-5 without residual or recurrent stenosis. 2. Progressive adjacent level disease with severe central canal stenosis and moderate to severe foraminal narrowing bilaterally at L3-4. 3. Stable mild subarticular and moderate foraminal  stenosis bilaterally at L2-3. 4. Moderate facet hypertrophy bilaterally at L5-S1 without significant stenosis.  PATIENT SURVEYS:  Modified Oswestry 24/50; 48%   COGNITION: Overall cognitive status: Within functional limits for tasks assessed     SENSATION: Sometimes feels a "disconnect between the hip and knee on L LE over the past several years which seems to be increasing in frequency   MUSCLE LENGTH: Hamstrings: Right 65 deg; Left 60 deg Thomas test: tight bilat  Piriformis - tight L > R   POSTURE: rounded shoulders and forward head  PALPATION: Tenderness L lateral sacral border distal toward coccyx; tenderness with PA mobs mid lumbar   LUMBAR ROM:   AROM eval  Flexion 70% pull   Extension 30% pain bilat LB   Right lateral flexion 70% tight   Left lateral flexion 70% tight   Right rotation 20% pull R scapula  Left rotation 10% pull R scapula   (Blank rows = not tested)  LOWER EXTREMITY ROM:     Active  Right eval Left eval  Hip flexion    Hip extension    Hip abduction    Hip adduction    Hip internal rotation  Tight   Hip external rotation  Tight   Knee flexion    Knee extension    Ankle dorsiflexion    Ankle plantarflexion    Ankle inversion    Ankle eversion     (Blank rows = not tested)  LOWER EXTREMITY MMT:  decreased core strength; LE strength WFL's bilat   MMT Right eval Left eval  Hip flexion    Hip extension    Hip abduction    Hip adduction    Hip internal rotation    Hip external rotation    Knee flexion    Knee extension    Ankle dorsiflexion    Ankle plantarflexion    Ankle inversion    Ankle eversion     (Blank rows = not tested)  LUMBAR SPECIAL TESTS:  Straight leg raise test: Negative and Slump test: Negative  FUNCTIONAL TESTS:  5 times sit  to stand: 15.96 sec pulling center of sacral area  SLS; R/L 10 sec   GAIT: Distance walked: 40 feet Assistive device utilized: None Level of assistance: Complete  Independence Comments: WFL's   TREATMENT DATE: 10/16/23                                                                                                                               POC HEP  Education re- back rehab   PATIENT EDUCATION:  Education details: POC; HEP  Person educated: Patient Education method: Programmer, multimedia, Facilities manager, Actor cues, Verbal cues, and Handouts Education comprehension: verbalized understanding, returned demonstration, verbal cues required, tactile cues required, and needs further education  HOME EXERCISE PROGRAM: Access Code: [email or web address] URL: [email or web address] Date: 10/16/2023 Prepared by: Laurna Shetley  Exercises - Supine Transversus Abdominis Bracing with Pelvic Floor Contraction  - 2 x daily - 7 x weekly - 1 sets - 10 reps - 10sec  hold - Supine Bridge  - 2 x daily - 7 x weekly - 1-2 sets - 10 reps - 5-10 sec  hold - Seated Hip Internal Rotation with Ball and Resistance  - 2 x daily - 7 x weekly - 1 sets - 10 reps - 3 sec  hold - Sit to Stand  - 2 x daily - 7 x weekly - 1 sets - 10 reps - 3-5 sec  hold  ASSESSMENT:  CLINICAL IMPRESSION: Patient is a 63 y.o. male who was seen today for physical therapy evaluation and treatment for chronic bilat LBP with pain into the sacrum and bilat buttock. He has a history of spinal fusion L4/5 2013 with no significant change in symptoms. Patient reports increased pain in the LB and sacral area in the past few weeks. He has limited trunk and LE mobility; poor core strength and stability; muscular tightness to palpation lumbar and sacral spine; pain with functional activities constant in nature, variable in intensity. Patient will benefit from PT to address problems identified.   OBJECTIVE IMPAIRMENTS: decreased mobility, decreased ROM, decreased strength, improper body mechanics, postural dysfunction, and pain.   ACTIVITY LIMITATIONS: carrying, lifting, bending, sitting, standing, squatting, and  sleeping  PARTICIPATION LIMITATIONS: driving, community activity, occupation, and yard work  PERSONAL FACTORS: Past/current experiences and Time since onset of injury/illness/exacerbation are also affecting patient's functional outcome.   REHAB POTENTIAL: Good  CLINICAL DECISION MAKING: Evolving/moderate complexity  EVALUATION COMPLEXITY: Moderate   GOALS: Goals reviewed with patient? Yes  SHORT TERM GOALS: Target date: 11/13/2023  Independent in initial HEP  Baseline: Goal status: INITIAL  2.  Education re-back care and body mechanics including sitting, standing, lying positions for work and home  Baseline:  Goal status: INITIAL  3.  Patient tolerates core strengthening and stabilization exercise program for 20-30 min  Baseline:  Goal status: INITIAL  LONG TERM GOALS: Target date:  12/11/2023   Decrease frequency, intensity, and/or duration of LBP by 30-50% allowing patient to participate in work and  leisure activities with less limitations  Baseline:  Goal status: INITIAL  2.  Improve core strength and stability with patient to tolerate 30-40 min of core strengthening exercises and activities with no increase in pain  Baseline:  Goal status: INITIAL  3.  Increase core strength and stability with patient to report decreased dependence on brace/lumbar support for yard work  Baseline:  Goal status: INITIAL  4.  Improve Modified Oswestry score by 10 points  Baseline: 24/50; 48%  Goal status: INITIAL  5.  Independent in HEP including aquatic program as indicated  Baseline:  Goal status: INITIAL  PLAN:  PT FREQUENCY: 2x/week  PT DURATION: 8 weeks  PLANNED INTERVENTIONS: 97164- PT Re-evaluation, 97110-Therapeutic exercises, 97530- Therapeutic activity, 97112- Neuromuscular re-education, 97535- Self Care, 16109- Manual therapy, [phone]- Aquatic Therapy, Patient/Family education, Taping, Dry Needling, and Joint mobilization.  PLAN FOR NEXT SESSION: review and progress  exercises; continue spine care and ergonomic correction and education; manual work and modalities as indicated    Delmi Fulfer P Marysa Wessner, PT 10/16/2023, 5:32 PM   For all possible CPT codes, reference the Planned Interventions line above.     Check all conditions that are expected to impact treatment: {Conditions expected to impact treatment:Musculoskeletal disorders   If treatment provided at initial evaluation, no treatment charged due to lack of authorization.

## 2023-10-16 ENCOUNTER — Encounter: Payer: Self-pay | Admitting: Rehabilitative and Restorative Service Providers"

## 2023-10-16 ENCOUNTER — Other Ambulatory Visit: Payer: Self-pay

## 2023-10-16 ENCOUNTER — Ambulatory Visit: Attending: Family Medicine | Admitting: Rehabilitative and Restorative Service Providers"

## 2023-10-16 DIAGNOSIS — R29898 Other symptoms and signs involving the musculoskeletal system: Secondary | ICD-10-CM | POA: Insufficient documentation

## 2023-10-16 DIAGNOSIS — M6281 Muscle weakness (generalized): Secondary | ICD-10-CM | POA: Diagnosis not present

## 2023-10-16 DIAGNOSIS — M545 Low back pain, unspecified: Secondary | ICD-10-CM | POA: Diagnosis not present

## 2023-10-16 DIAGNOSIS — G8929 Other chronic pain: Secondary | ICD-10-CM | POA: Diagnosis not present

## 2023-10-16 DIAGNOSIS — M5459 Other low back pain: Secondary | ICD-10-CM

## 2023-10-21 ENCOUNTER — Other Ambulatory Visit: Payer: Self-pay | Admitting: Family Medicine

## 2023-10-21 DIAGNOSIS — Z0000 Encounter for general adult medical examination without abnormal findings: Secondary | ICD-10-CM

## 2023-10-29 ENCOUNTER — Ambulatory Visit: Admitting: Rehabilitative and Restorative Service Providers"

## 2023-10-31 ENCOUNTER — Ambulatory Visit: Admitting: Rehabilitative and Restorative Service Providers"

## 2023-11-06 ENCOUNTER — Encounter (HOSPITAL_COMMUNITY): Payer: Self-pay

## 2023-11-06 ENCOUNTER — Encounter: Payer: Self-pay | Admitting: Rehabilitative and Restorative Service Providers"

## 2023-11-06 ENCOUNTER — Ambulatory Visit: Admitting: Rehabilitative and Restorative Service Providers"

## 2023-11-06 DIAGNOSIS — R29898 Other symptoms and signs involving the musculoskeletal system: Secondary | ICD-10-CM | POA: Diagnosis not present

## 2023-11-06 DIAGNOSIS — M6281 Muscle weakness (generalized): Secondary | ICD-10-CM

## 2023-11-06 DIAGNOSIS — M5459 Other low back pain: Secondary | ICD-10-CM

## 2023-11-06 DIAGNOSIS — M545 Low back pain, unspecified: Secondary | ICD-10-CM | POA: Diagnosis not present

## 2023-11-06 DIAGNOSIS — G8929 Other chronic pain: Secondary | ICD-10-CM | POA: Diagnosis not present

## 2023-11-06 NOTE — Therapy (Signed)
 OUTPATIENT PHYSICAL THERAPY THORACOLUMBAR TREATMENT   Patient Name: Brian Russell MRN: [id number] DOB:02-03-1961, 63 y.o., male Today's Date: 11/06/2023  END OF SESSION:  PT End of Session - 11/06/23 1617     Visit Number 2    Number of Visits 16    Date for PT Re-Evaluation 12/11/23    Authorization Type [employer] of [place] auth required [employer]    PT Start Time 1615    PT Stop Time 1700    PT Time Calculation (min) 45 min    Activity Tolerance Patient tolerated treatment well          Past Medical History:  Diagnosis Date   Hypertension    Low thyroid  stimulating hormone (TSH) level    Past Surgical History:  Procedure Laterality Date   fractured bone     JOINT REPLACEMENT     SPINAL FUSION     Patient Active Problem List   Diagnosis Date Noted   Tinnitus of both ears 10/11/2023   Family history of sudden cardiac death in son 2023-06-25   Acute sinusitis 04/11/2022   Grief at loss of child 01/19/2022   Watering of eye 11/01/2021   Insomnia 09/12/2021   Vertigo 01/30/2021   Lumbar foraminal stenosis 08/31/2020   Bilateral shoulder pain 06/17/2020   Low back pain with sciatica 06/17/2020   Eustachian tube dysfunction 01/06/2020   Cervical spondylosis 12/10/2019   Essential hypertension 10/15/2019   Hypothyroidism 10/15/2019   HLD (hyperlipidemia) 10/15/2019   Chronic low back pain 10/15/2019   Tinea pedis 10/15/2019   Well adult exam 10/15/2019   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   Neck pain 09/25/2019    PCP: Dr Velma Ku  REFERRING PROVIDER: Dr Velma Ku  REFERRING DIAG: Chronic R sided LBP   Rationale for Evaluation and Treatment: Rehabilitation  THERAPY DIAG:  Other low back pain  Other symptoms and signs involving the musculoskeletal system  Muscle weakness (generalized)  ONSET DATE: 09/19/23  SUBJECTIVE:                                                                                                                                                                                            SUBJECTIVE STATEMENT: Patient reports that he has been doing exercises consistently at home. Knows the progress will take time with the exercises.   EVAL: Patient has a history of chronic bilat LBP and pain into the coccyx and bilat buttock for the past 30 years. Symptoms have been worsening over the past few weeks with no known cause. Symptoms come and go and he is not sure what irritates the symptoms  PERTINENT HISTORY:  HTN; low TSH; fx L clavicle and L elbow; bilat knee scopes for meniscus injuries ~ 15 yrs ago; laminectomy with spinal fusion L4/5 ~ 2013 with no significant improvement  PAIN:  Are you having pain? Yes: NPRS scale: 4-5/10; worst in the past week 9/10 Pain location: LB to coccyx to bilat buttocks  Pain description: throbbing; sometimes sharp  Aggravating factors: heavy lifting otherwise unknown cause  Relieving factors: rest; use of back brace for work in the yard   PRECAUTIONS: None  WEIGHT BEARING RESTRICTIONS: No  FALLS:  Has patient fallen in last 6 months? 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  DIAGNOSTIC FINDINGS:  MRI 10/30/20: IMPRESSION: 1. Laminectomy and fusion at L4-5 without residual or recurrent stenosis. 2. Progressive adjacent level disease with severe central  canal stenosis and moderate to severe foraminal narrowing bilaterally at L3-4. 3. Stable mild subarticular and moderate foraminal stenosis bilaterally at L2-3. 4. Moderate facet hypertrophy bilaterally at L5-S1 without significant stenosis.  PATIENT SURVEYS:  Modified Oswestry 24/50; 48%    SENSATION: Sometimes feels a disconnect between the hip and knee on L LE over the past several years which seems to be increasing in frequency   MUSCLE LENGTH: Hamstrings: Right 65 deg; Left 60 deg Thomas test: tight bilat  Piriformis - tight L > R   POSTURE: rounded shoulders and forward head  PALPATION: Tenderness L lateral sacral border distal toward coccyx; tenderness with PA mobs mid lumbar   LUMBAR ROM:   AROM eval  Flexion 70% pull   Extension 30% pain bilat LB   Right lateral flexion 70% tight   Left lateral flexion 70% tight   Right rotation 20% pull R scapula  Left rotation 10% pull R scapula   (Blank rows = not tested)  LOWER EXTREMITY ROM:     Active  Right eval Left eval  Hip flexion    Hip extension    Hip abduction    Hip adduction    Hip internal rotation  Tight   Hip external rotation  Tight   Knee flexion    Knee extension    Ankle dorsiflexion    Ankle plantarflexion    Ankle inversion    Ankle eversion     (Blank rows = not tested)  LOWER EXTREMITY MMT:  decreased core strength; LE strength WFL's bilat   MMT Right eval Left eval  Hip flexion    Hip extension    Hip abduction    Hip adduction    Hip internal rotation    Hip external rotation    Knee flexion    Knee extension    Ankle dorsiflexion    Ankle plantarflexion    Ankle inversion    Ankle eversion     (Blank rows = not tested)  LUMBAR SPECIAL TESTS:  Straight leg raise test: Negative and Slump test: Negative  FUNCTIONAL TESTS:  5 times sit to stand: 15.96 sec pulling center of sacral area  SLS; R/L 10 sec   GAIT: Distance walked: 40 feet Assistive device utilized:  None Level of assistance: Complete Independence Comments: WFL's   [facility] Adult PT Treatment:                                                DATE: 11/06/23 Therapeutic Exercise: Supine  4 part core x 10  Diaphragmatic breathing  Manual Therapy:  Neuromuscular re-ed: Postural alignment working with hips aligned back to treatment table working  Therapeutic Activity: Supine  Bridging 5 sec x 10  Shoulder flexion hooklying green TB btn hands 3 sec x 10     Sitting IR ball btn knees green TB at ankles 3 sec x 10  Sit to stand x 10  Sit to stand L LE back slightly compared to R x 10  Standing Hip abduction leading with heel green TB 3 sec x 10 x 2 R/L  Terminal knee extension black TB 5 sec x 10  Self Care: Body mechanics with transitional movements    TREATMENT DATE: 10/16/23                                                                                                                               POC HEP  Education re- back rehab   PATIENT EDUCATION:  Education details: POC; HEP  Person educated: Patient Education method: Programmer, multimedia, Facilities manager, Actor cues, Verbal cues, and Handouts Education comprehension: verbalized understanding, returned demonstration, verbal cues required, tactile cues required, and needs further education  HOME EXERCISE PROGRAM: Access Code: [email or web address] URL: [email or web address] Date: 11/06/2023 Prepared by: Malayiah Mcbrayer  Exercises - Supine Transversus Abdominis Bracing with Pelvic Floor Contraction  - 1 x daily - 7 x weekly - 1 sets - 10 reps - 10sec  hold - Supine Bridge  - 1 x daily - 7 x weekly - 1-2 sets - 10 reps - 5-10 sec  hold - Seated Hip Internal Rotation with Ball and Resistance  - 1 x daily - 7 x weekly - 1 sets - 10 reps - 3 sec  hold - Sit to Stand  - 1 x daily - 7 x weekly - 1 sets - 10 reps - 3-5 sec  hold - Supine Diaphragmatic Breathing  - 1 x daily - 7 x weekly - 1 sets - 10 reps - 4-6 sec  hold - Supine Shoulder  Flexion with Dowel  - 1 x daily - 7 x weekly - 1 sets - 10 reps - 3 sec  hold - Hooklying Isometric Clamshell  - 1 x daily - 7 x weekly - 1 sets - 10 reps - 3 sec  hold - Hip Abduction with Resistance Loop  - 1 x daily -  7 x weekly - 2 sets - 10 reps - 3 sec  hold - Standing Terminal Knee Extension with Resistance  - 1 x daily - 7 x weekly - 2-3 sets - 10 reps - 3-5 sec  hold  ASSESSMENT:  CLINICAL IMPRESSION: Patient reports that he has been working on exercises at home. No significant change in symptoms noted. Reviewed and progressed exercises. Continued with core strengthening and stabilization. Education re-body mechanics with transitional movements. Alm has increased weakness L hip and LE compared to R; decreased L knee terminal knee extension; L trunk lateral flexed; R shoulder elevated; head slightly tilted L. Worked on correction of alignment in standing.   EVAL: Patient is a 63 y.o. male who was seen today for physical therapy evaluation and treatment for chronic bilat LBP with pain into the sacrum and bilat buttock. He has a history of spinal fusion L4/5 2013 with no significant change in symptoms. Patient reports increased pain in the LB and sacral area in the past few weeks. He has limited trunk and LE mobility; poor core strength and stability; muscular tightness to palpation lumbar and sacral spine; pain with functional activities constant in nature, variable in intensity. Patient will benefit from PT to address problems identified.   GOALS: Goals reviewed with patient? Yes  SHORT TERM GOALS: Target date: 11/13/2023  Independent in initial HEP  Baseline: Goal status: INITIAL  2.  Education re-back care and body mechanics including sitting, standing, lying positions for work and home  Baseline:  Goal status: INITIAL  3.  Patient tolerates core strengthening and stabilization exercise program for 20-30 min  Baseline:  Goal status: INITIAL  LONG TERM GOALS: Target date:   12/11/2023   Decrease frequency, intensity, and/or duration of LBP by 30-50% allowing patient to participate in work and leisure activities with less limitations  Baseline:  Goal status: INITIAL  2.  Improve core strength and stability with patient to tolerate 30-40 min of core strengthening exercises and activities with no increase in pain  Baseline:  Goal status: INITIAL  3.  Increase core strength and stability with patient to report decreased dependence on brace/lumbar support for yard work  Baseline:  Goal status: INITIAL  4.  Improve Modified Oswestry score by 10 points  Baseline: 24/50; 48%  Goal status: INITIAL  5.  Independent in HEP including aquatic program as indicated  Baseline:  Goal status: INITIAL  PLAN:  PT FREQUENCY: 2x/week  PT DURATION: 8 weeks  PLANNED INTERVENTIONS: 97164- PT Re-evaluation, 97110-Therapeutic exercises, 97530- Therapeutic activity, 97112- Neuromuscular re-education, 97535- Self Care, [id number]- Manual therapy, [phone]- Aquatic Therapy, Patient/Family education, Taping, Dry Needling, and Joint mobilization.  PLAN FOR NEXT SESSION: review and progress exercises; continue spine care and ergonomic correction and education; manual work and modalities as indicated  Work toward hip abduction at wall; paloff press + variations; strengthen hip rotation L > R    Jonte Wollam P Staley Budzinski, PT 11/06/2023, 5:20 PM   For all possible CPT codes, reference the Planned Interventions line above.     Check all conditions that are expected to impact treatment: {Conditions expected to impact treatment:Musculoskeletal disorders   If treatment provided at initial evaluation, no treatment charged due to lack of authorization.

## 2023-11-07 ENCOUNTER — Ambulatory Visit (HOSPITAL_COMMUNITY)

## 2023-11-08 ENCOUNTER — Other Ambulatory Visit: Payer: Self-pay | Admitting: Family Medicine

## 2023-11-08 DIAGNOSIS — G8929 Other chronic pain: Secondary | ICD-10-CM

## 2023-11-14 ENCOUNTER — Other Ambulatory Visit: Payer: Self-pay | Admitting: Family Medicine

## 2023-11-14 ENCOUNTER — Ambulatory Visit: Attending: Family Medicine

## 2023-11-14 DIAGNOSIS — M6281 Muscle weakness (generalized): Secondary | ICD-10-CM | POA: Diagnosis not present

## 2023-11-14 DIAGNOSIS — R29898 Other symptoms and signs involving the musculoskeletal system: Secondary | ICD-10-CM | POA: Insufficient documentation

## 2023-11-14 DIAGNOSIS — M5459 Other low back pain: Secondary | ICD-10-CM | POA: Diagnosis not present

## 2023-11-14 NOTE — Therapy (Signed)
 OUTPATIENT PHYSICAL THERAPY THORACOLUMBAR TREATMENT   Patient Name: Brian Russell MRN: [id number] DOB:07-17-1960, 63 y.o., male Today's Date: 11/14/2023  END OF SESSION:  PT End of Session - 11/14/23 1521     Visit Number 3    Number of Visits 16    Date for PT Re-Evaluation 12/11/23    Authorization Type [employer] of [place] auth    Authorization Time Period PT 10/16/2023-12/14/2023    Authorization - Visit Number 3    Authorization - Number of Visits 7    PT Start Time 1525    PT Stop Time 1618    PT Time Calculation (min) 53 min    Activity Tolerance Patient tolerated treatment well    Behavior During Therapy [facility] for tasks assessed/performed         Past Medical History:  Diagnosis Date   Hypertension    Low thyroid  stimulating hormone (TSH) level    Past Surgical History:  Procedure Laterality Date   fractured bone     JOINT REPLACEMENT     SPINAL FUSION     Patient Active Problem List   Diagnosis Date Noted   Tinnitus of both ears 10/11/2023   Family history of sudden cardiac death in son 06/23/23   Acute sinusitis 04/11/2022   Grief at loss of child 01/19/2022   Watering of eye 11/01/2021   Insomnia 09/12/2021   Vertigo 01/30/2021   Lumbar foraminal stenosis 08/31/2020   Bilateral shoulder pain 06/17/2020   Low back pain with sciatica 06/17/2020   Eustachian tube dysfunction 01/06/2020   Cervical spondylosis 12/10/2019   Essential hypertension 10/15/2019   Hypothyroidism 10/15/2019   HLD (hyperlipidemia) 10/15/2019   Chronic low back pain 10/15/2019   Tinea pedis 10/15/2019   Well adult exam 10/15/2019   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   Neck pain 09/25/2019    PCP: Dr Velma Ku  REFERRING PROVIDER: Dr Velma Ku  REFERRING DIAG: Chronic R sided LBP   Rationale for Evaluation and Treatment: Rehabilitation  THERAPY DIAG:  Other low back pain  Other symptoms and signs involving the musculoskeletal system  Muscle weakness  (generalized)  ONSET DATE: 09/19/23  SUBJECTIVE:                                                                                                                                                                                           SUBJECTIVE STATEMENT: Patient reports he was sore in hips after last PT session; states he has no pain at the moment but that can change if he sits and then stands back up.  EVAL: Patient has a history of chronic bilat  LBP and pain into the coccyx and bilat buttock for the past 30 years. Symptoms have been worsening over the past few weeks with no known cause. Symptoms come and go and he is not sure what irritates the symptoms    PERTINENT HISTORY:  HTN; low TSH; fx L clavicle and L elbow; bilat knee scopes for meniscus injuries ~ 15 yrs ago; laminectomy with spinal fusion L4/5 ~ 2013 with no significant improvement  PAIN:  Are you having pain? Yes: NPRS scale: 4-5/10; worst in the past week 9/10 Pain location: LB to coccyx to bilat buttocks  Pain description: throbbing; sometimes sharp  Aggravating factors: heavy lifting otherwise unknown cause  Relieving factors: rest; use of back brace for work in the yard   PRECAUTIONS: None  WEIGHT BEARING RESTRICTIONS: No  FALLS:  Has patient fallen in last 6 months? 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  DIAGNOSTIC FINDINGS:  MRI 10/30/20: IMPRESSION: 1. Laminectomy and fusion at L4-5 without residual or recurrent stenosis. 2. Progressive adjacent level disease with severe central canal stenosis and moderate to severe foraminal narrowing bilaterally at L3-4. 3. Stable mild subarticular and moderate foraminal stenosis bilaterally at L2-3. 4. Moderate facet hypertrophy bilaterally at L5-S1 without significant stenosis.  PATIENT SURVEYS:  Modified Oswestry 24/50; 48%    SENSATION: Sometimes feels a disconnect between the hip and knee on L LE over the past several years which seems to be increasing in frequency   MUSCLE LENGTH: Hamstrings: Right 65 deg; Left 60 deg Thomas test: tight bilat  Piriformis - tight L > R   POSTURE: rounded shoulders and forward head  PALPATION: Tenderness L lateral sacral border distal toward coccyx; tenderness with PA mobs mid lumbar   LUMBAR ROM:   AROM eval  Flexion 70% pull   Extension 30% pain bilat LB   Right lateral flexion 70% tight   Left lateral flexion 70% tight   Right rotation 20% pull R scapula  Left rotation 10% pull R scapula   (Blank rows = not tested)  LOWER EXTREMITY ROM:     Active  Right eval Left eval  Hip flexion    Hip extension    Hip abduction    Hip adduction    Hip internal rotation  Tight   Hip external rotation  Tight   Knee flexion    Knee extension    Ankle dorsiflexion    Ankle plantarflexion    Ankle inversion    Ankle eversion     (Blank rows = not tested)  LOWER EXTREMITY MMT:  decreased core strength; LE strength WFL's bilat   MMT Right eval Left eval  Hip flexion    Hip extension    Hip abduction    Hip adduction    Hip internal rotation    Hip external rotation    Knee flexion    Knee extension  Ankle dorsiflexion    Ankle plantarflexion    Ankle inversion    Ankle eversion     (Blank rows = not tested)  LUMBAR SPECIAL TESTS:  Straight leg raise test:  Negative and Slump test: Negative  FUNCTIONAL TESTS:  5 times sit to stand: 15.96 sec pulling center of sacral area  SLS; R/L 10 sec   GAIT: Distance walked: 40 feet Assistive device utilized: None Level of assistance: Complete Independence Comments: WFL's    [facility] Adult PT Treatment:                                                DATE: 11/14/2023 Therapeutic Exercise: Supine: LTR x 1 min Wide leg LTR x 1 min Seated side bend QL stretch DKTC stretch Single leg stretch  Double leg stretch  Scissors Supine figure 4 stretch (press down & pull up) Neuromuscular re-ed: Side Lying: Resisted clamshells + GTB  Resisted straight leg hip abd + GTB  Leg over & back Deadbug 90/90 isometric hold --> transitioning into position with TA activation Bent over hip extension on diagonal  Therapeutic Activity: Staggered stance with Rt foot forward (both sides) + paloff press with blue TB --> added vertical lift  Rt foot fwd staggered stance + Rt arm press down (extension) & Lt arm press out (abd) with GTB wall band     [facility] Adult PT Treatment:                                                DATE: 11/06/23 Therapeutic Exercise: Supine  4 part core x 10  Diaphragmatic breathing  Manual Therapy:  Neuromuscular re-ed: Postural alignment working with hips aligned back to treatment table working  Therapeutic Activity: Supine  Bridging 5 sec x 10  Shoulder flexion hooklying green TB btn hands 3 sec x 10     Sitting IR ball btn knees green TB at ankles 3 sec x 10  Sit to stand x 10  Sit to stand L LE back slightly compared to R x 10  Standing Hip abduction leading with heel green TB 3 sec x 10 x 2 R/L  Terminal knee extension black TB 5 sec x 10  Self Care: Body mechanics with transitional movements    TREATMENT DATE: 10/16/23                                                                                                                               POC HEP  Education re- back rehab    PATIENT EDUCATION:  Education details: POC; HEP  Person educated: Patient Education method: Programmer, multimedia, Facilities manager, Actor cues, Verbal cues, and Handouts Education comprehension: verbalized understanding,  returned demonstration, verbal cues required, tactile cues required, and needs further education  HOME EXERCISE PROGRAM: Access Code: [email or web address] URL: [email or web address] Date: 11/14/2023 Prepared by: Lamarr Price  Exercises - Supine Transversus Abdominis Bracing with Pelvic Floor Contraction  - 1 x daily - 7 x weekly - 1 sets - 10 reps - 10sec  hold - Supine Bridge  - 1 x daily - 7 x weekly - 1-2 sets - 10 reps - 5-10 sec  hold - Seated Hip Internal Rotation with Ball and Resistance  - 1 x daily - 7 x weekly - 1 sets - 10 reps - 3 sec  hold - Sit to Stand  - 1 x daily - 7 x weekly - 1 sets - 10 reps - 3-5 sec  hold - Supine Diaphragmatic Breathing  - 1 x daily - 7 x weekly - 1 sets - 10 reps - 4-6 sec  hold - Supine Shoulder Flexion with Dowel  - 1 x daily - 7 x weekly - 1 sets - 10 reps - 3 sec  hold - Hooklying Isometric Clamshell  - 1 x daily - 7 x weekly - 1 sets - 10 reps - 3 sec  hold - Hip Abduction with Resistance Loop  - 1 x daily - 7 x weekly - 2 sets - 10 reps - 3 sec  hold - Standing Terminal Knee Extension with Resistance  - 1 x daily - 7 x weekly - 2-3 sets - 10 reps - 3-5 sec  hold - Supine Dead Bug with Leg Extension  - 1 x daily - 7 x weekly - 3 sets - 10 reps - Clam with Resistance  - 1 x daily - 7 x weekly - 3 sets - 10 reps - Sidelying Hip Abduction with Resistance at Thighs  - 1 x daily - 7 x weekly - 3 sets - 10 reps - Sidelying Over and Back  - 1 x daily - 7 x weekly - 3 sets - 10 reps - Diagonal Hip Extension  - 1 x daily - 7 x weekly - 3 sets - 10 reps - Squatting Anti-Rotation Press  - 1 x daily - 7 x weekly - 3 sets - 10 reps  ASSESSMENT:  CLINICAL IMPRESSION: Session focused on core strengthening with progression from supine to  standing. Cueing right foot forward in staggered stance improved pelvic alignment in standing. Noted right elevated shoulder compensation with resisted press down; cueing asymmetrical arm placement with resisted arm pulls improved shoulder girdle alignment and bilateral scapula retraction mechanics. HEP updated with core and hip strengthening exercises.  EVAL: Patient is a 63 y.o. male who was seen today for physical therapy evaluation and treatment for chronic bilat LBP with pain into the sacrum and bilat buttock. He has a history of spinal fusion L4/5 2013 with no significant change in symptoms. Patient reports increased pain in the LB and sacral area in the past few weeks. He has limited trunk and LE mobility; poor core strength and stability; muscular tightness to palpation lumbar and sacral spine; pain with functional activities constant in nature, variable in intensity. Patient will benefit from PT to address problems identified.   GOALS: Goals reviewed with patient? Yes  SHORT TERM GOALS: Target date: 11/13/2023  Independent in initial HEP  Baseline: Goal status: MET  2.  Education re-back care and body mechanics including sitting, standing, lying positions for work and home  Baseline:  Goal status: PARTIALLY MET  3.  Patient tolerates core  strengthening and stabilization exercise program for 20-30 min  Baseline:  Goal status: MET   LONG TERM GOALS: Target date:  12/11/2023  Decrease frequency, intensity, and/or duration of LBP by 30-50% allowing patient to participate in work and leisure activities with less limitations  Baseline:  Goal status: INITIAL  2.  Improve core strength and stability with patient to tolerate 30-40 min of core strengthening exercises and activities with no increase in pain  Baseline:  Goal status: INITIAL  3.  Increase core strength and stability with patient to report decreased dependence on brace/lumbar support for yard work  Baseline:  Goal status:  INITIAL  4.  Improve Modified Oswestry score by 10 points  Baseline: 24/50; 48%  Goal status: INITIAL  5.  Independent in HEP including aquatic program as indicated  Baseline:  Goal status: INITIAL  PLAN:  PT FREQUENCY: 2x/week  PT DURATION: 8 weeks  PLANNED INTERVENTIONS: 97164- PT Re-evaluation, 97110-Therapeutic exercises, 97530- Therapeutic activity, 97112- Neuromuscular re-education, 97535- Self Care, [id number]- Manual therapy, [phone]- Aquatic Therapy, Patient/Family education, Taping, Dry Needling, and Joint mobilization.  PLAN FOR NEXT SESSION: Hip/glute/core strengthening; progress standing hip abduction; paloff press + variations; strengthen hip rotation L > R      Alm has increased weakness L hip and LE compared to R; decreased L knee terminal knee extension; L trunk lateral flexed; R shoulder elevated; head slightly tilted L.   Lamarr GORMAN Price, PTA 11/14/2023, 4:29 PM

## 2023-11-19 ENCOUNTER — Ambulatory Visit

## 2023-11-19 DIAGNOSIS — R29898 Other symptoms and signs involving the musculoskeletal system: Secondary | ICD-10-CM | POA: Diagnosis not present

## 2023-11-19 DIAGNOSIS — M5459 Other low back pain: Secondary | ICD-10-CM | POA: Diagnosis not present

## 2023-11-19 DIAGNOSIS — M6281 Muscle weakness (generalized): Secondary | ICD-10-CM

## 2023-11-19 NOTE — Therapy (Signed)
 OUTPATIENT PHYSICAL THERAPY THORACOLUMBAR TREATMENT   Patient Name: Brian Russell MRN: [id number] DOB:04-Apr-1961, 63 y.o., male Today's Date: 11/19/2023  END OF SESSION:  PT End of Session - 11/19/23 1535     Visit Number 4    Number of Visits 16    Date for PT Re-Evaluation 12/11/23    Authorization Type [employer] [place] auth    Authorization Time Period PT 10/16/2023-12/14/2023    Authorization - Visit Number 4    Authorization - Number of Visits 7    PT Start Time 1535    PT Stop Time 1616    PT Time Calculation (min) 41 min    Activity Tolerance Patient tolerated treatment well    Behavior During Therapy [facility] for tasks assessed/performed         Past Medical History:  Diagnosis Date   Hypertension    Low thyroid  stimulating hormone (TSH) level    Past Surgical History:  Procedure Laterality Date   fractured bone     JOINT REPLACEMENT     SPINAL FUSION     Patient Active Problem List   Diagnosis Date Noted   Tinnitus of both ears 10/11/2023   Family history of sudden cardiac death in son 2023/06/10   Acute sinusitis 04/11/2022   Grief at loss of child 01/19/2022   Watering of eye 11/01/2021   Insomnia 09/12/2021   Vertigo 01/30/2021   Lumbar foraminal stenosis 08/31/2020   Bilateral shoulder pain 06/17/2020   Low back pain with sciatica 06/17/2020   Eustachian tube dysfunction 01/06/2020   Cervical spondylosis 12/10/2019   Essential hypertension 10/15/2019   Hypothyroidism 10/15/2019   HLD (hyperlipidemia) 10/15/2019   Chronic low back pain 10/15/2019   Tinea pedis 10/15/2019   Well adult exam 10/15/2019   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   Neck pain 09/25/2019    PCP: Dr Velma Ku  REFERRING PROVIDER: Dr Velma Ku  REFERRING DIAG: Chronic R sided LBP   Rationale for Evaluation and Treatment: Rehabilitation  THERAPY DIAG:  Other low back pain  Other symptoms and signs involving the musculoskeletal system  Muscle weakness  (generalized)  ONSET DATE: 09/19/23  SUBJECTIVE:                                                                                                                                                                                           SUBJECTIVE STATEMENT: Patient reports he did not do any HEP over the weekend due to being busy with house chores. Patient states he was sore after last PT session but that is normal.   EVAL: Patient has a history of chronic  bilat LBP and pain into the coccyx and bilat buttock for the past 30 years. Symptoms have been worsening over the past few weeks with no known cause. Symptoms come and go and he is not sure what irritates the symptoms    PERTINENT HISTORY:  HTN; low TSH; fx L clavicle and L elbow; bilat knee scopes for meniscus injuries ~ 15 yrs ago; laminectomy with spinal fusion L4/5 ~ 2013 with no significant improvement  PAIN:  Are you having pain? Yes: NPRS scale: 4-5/10; worst in the past week 9/10 Pain location: LB to coccyx to bilat buttocks  Pain description: throbbing; sometimes sharp  Aggravating factors: heavy lifting otherwise unknown cause  Relieving factors: rest; use of back brace for work in the yard   PRECAUTIONS: None  WEIGHT BEARING RESTRICTIONS: No  FALLS:  Has patient fallen in last 6 months? 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  DIAGNOSTIC FINDINGS:  MRI 10/30/20: IMPRESSION: 1. Laminectomy and fusion at L4-5 without residual or recurrent stenosis. 2. Progressive adjacent level disease with severe central canal stenosis and moderate to severe foraminal narrowing bilaterally at L3-4. 3. Stable mild subarticular and moderate foraminal stenosis bilaterally at L2-3. 4. Moderate facet hypertrophy bilaterally at L5-S1 without significant stenosis.  PATIENT SURVEYS:  Modified Oswestry 24/50; 48%    SENSATION: Sometimes feels a disconnect between the hip and knee on L LE over the past several years which seems to be increasing in frequency   MUSCLE LENGTH: Hamstrings: Right 65 deg; Left 60 deg Thomas test: tight bilat  Piriformis - tight L > R   POSTURE: rounded shoulders and forward head  PALPATION: Tenderness L lateral sacral border distal toward coccyx; tenderness with PA mobs mid lumbar   LUMBAR ROM:   AROM eval  Flexion 70% pull   Extension 30% pain bilat LB   Right lateral flexion 70% tight   Left lateral flexion 70% tight   Right rotation 20% pull R scapula  Left rotation 10% pull R scapula   (Blank rows = not tested)  LOWER EXTREMITY ROM:     Active  Right eval Left eval  Hip flexion    Hip extension    Hip abduction    Hip adduction    Hip internal rotation  Tight   Hip external rotation  Tight   Knee flexion    Knee extension    Ankle dorsiflexion    Ankle plantarflexion    Ankle inversion    Ankle eversion     (Blank rows = not tested)  LOWER EXTREMITY MMT:  decreased core strength; LE strength WFL's bilat   MMT Right eval Left eval  Hip flexion    Hip extension    Hip abduction    Hip adduction    Hip internal rotation    Hip external rotation    Knee flexion    Knee  extension    Ankle dorsiflexion    Ankle plantarflexion    Ankle inversion    Ankle eversion     (Blank rows = not tested)  LUMBAR SPECIAL TESTS:  Straight  leg raise test: Negative and Slump test: Negative  FUNCTIONAL TESTS:  5 times sit to stand: 15.96 sec pulling center of sacral area  SLS; R/L 10 sec   GAIT: Distance walked: 40 feet Assistive device utilized: None Level of assistance: Complete Independence Comments: WFL's    [facility] Adult PT Treatment:                                                DATE: 11/19/2023 Therapeutic Exercise: LTR x 1 min SKTC  DKTC Figure 4 stretch --> supine bilateral  Seated figure 4 (Rt) Seated hip IR + YTB & ball b/w knees 2x10x2 Neuromuscular re-ed: 90/90 heel taps Dead bug Side Lying: Clamshells + GTB 2x10 Bent knee hip abd + GTB x5 Straight leg hip abd + GTB x10 Small leg circles in hip abd  Typewriter bridge (alt) x10 Standing lateral pelvic glide to Rt against wall 10x3 Therapeutic Activity: Squat lift mechanics --> wide stance, staggered stance   [facility] Adult PT Treatment:                                                DATE: 11/14/2023 Therapeutic Exercise: Supine: LTR x 1 min Wide leg LTR x 1 min Seated side bend QL stretch DKTC stretch Single leg stretch  Double leg stretch  Scissors Supine figure 4 stretch (press down & pull up) Neuromuscular re-ed: Side Lying: Resisted clamshells + GTB  Resisted straight leg hip abd + GTB  Leg over & back Deadbug 90/90 isometric hold --> transitioning into position with TA activation Bent over hip extension on diagonal  Therapeutic Activity: Staggered stance with Rt foot forward (both sides) + paloff press with blue TB --> added vertical lift  Rt foot fwd staggered stance + Rt arm press down (extension) & Lt arm press out (abd) with GTB wall band     [facility] Adult PT Treatment:                                                DATE: 11/06/23 Therapeutic Exercise: Supine  4 part core x 10  Diaphragmatic breathing  Manual Therapy:  Neuromuscular re-ed: Postural alignment working with hips aligned back to treatment table working   Therapeutic Activity: Supine  Bridging 5 sec x 10  Shoulder flexion hooklying green TB btn hands 3 sec x 10     Sitting IR ball btn knees green TB at ankles 3 sec x 10  Sit to stand x 10  Sit to stand L LE back slightly compared to R x 10  Standing Hip abduction leading with heel green TB 3 sec x 10 x 2 R/L  Terminal knee extension black TB 5 sec x 10  Self Care: Body mechanics with transitional movements    PATIENT EDUCATION:  Education details: Updated HEP  Person educated: Patient Education method: Explanation, Demonstration, Tactile cues,  Verbal cues, and Handouts Education comprehension: verbalized understanding, returned demonstration, verbal cues required, tactile cues required, and needs further education  HOME EXERCISE PROGRAM: Access Code: [email or web address] URL: [email or web address] Date: 11/19/2023 Prepared by: Lamarr Price  Exercises - Supine Transversus Abdominis Bracing with Pelvic Floor Contraction  - 1 x daily - 7 x weekly - 1 sets - 10 reps - 10sec  hold - Supine Bridge  - 1 x daily - 7 x weekly - 1-2 sets - 10 reps - 5-10 sec  hold - Seated Hip Internal Rotation with Ball and Resistance  - 1 x daily - 7 x weekly - 1 sets - 10 reps - 3 sec  hold - Sit to Stand  - 1 x daily - 7 x weekly - 1 sets - 10 reps - 3-5 sec  hold - Supine Diaphragmatic Breathing  - 1 x daily - 7 x weekly - 1 sets - 10 reps - 4-6 sec  hold - Supine Shoulder Flexion with Dowel  - 1 x daily - 7 x weekly - 1 sets - 10 reps - 3 sec  hold - Hooklying Isometric Clamshell  - 1 x daily - 7 x weekly - 1 sets - 10 reps - 3 sec  hold - Hip Abduction with Resistance Loop  - 1 x daily - 7 x weekly - 2 sets - 10 reps - 3 sec  hold - Standing Terminal Knee Extension with Resistance  - 1 x daily - 7 x weekly - 2-3 sets - 10 reps - 3-5 sec  hold - Supine Dead Bug with Leg Extension  - 1 x daily - 7 x weekly - 3 sets - 10 reps - Clam with Resistance  - 1 x daily - 7 x weekly - 3 sets - 10 reps -  Sidelying Hip Abduction with Resistance at Thighs  - 1 x daily - 7 x weekly - 3 sets - 10 reps - Sidelying Over and Back  - 1 x daily - 7 x weekly - 3 sets - 10 reps - Diagonal Hip Extension  - 1 x daily - 7 x weekly - 3 sets - 10 reps - Squatting Anti-Rotation Press  - 1 x daily - 7 x weekly - 3 sets - 10 reps - Right Standing Lateral Shift Correction at Wall - Repetitions  - 1 x daily - 7 x weekly - 3 sets - 10 reps  ASSESSMENT:  CLINICAL IMPRESSION: Hip strengthening continued, progressing external/internal rotation mobility. Greater tightness/tension noted with Rt hip external rotation compared to Lt; greater limited mobility with Lt hip internal rotation as compared to Rt. Patient demonstrated good body mechanics with squatting and lifting activities; reviewed varying positions and lifting techniques for patient to incorporate with his house work.  EVAL: Patient is a 63 y.o. male who was seen today for physical therapy evaluation and treatment for chronic bilat LBP with pain into the sacrum and bilat buttock. He has a history of spinal fusion L4/5 2013 with no significant change in symptoms. Patient reports increased pain in the LB and sacral area in the past few weeks. He has limited trunk and LE mobility; poor core strength and stability; muscular tightness to palpation lumbar and sacral spine; pain with functional activities constant in nature, variable in intensity. Patient will benefit from PT to address problems identified.   GOALS: Goals reviewed with patient? Yes  SHORT TERM GOALS: Target date: 11/13/2023  Independent in initial HEP  Baseline: Goal status: MET  2.  Education re-back care and body mechanics including sitting, standing, lying positions for work and home  Baseline:  Goal status: PARTIALLY MET  3.  Patient tolerates core strengthening and stabilization exercise program for 20-30 min  Baseline:  Goal status: MET   LONG TERM GOALS: Target date:   12/11/2023  Decrease frequency, intensity, and/or duration of LBP by 30-50% allowing patient to participate in work and leisure activities with less limitations  Baseline:  Goal status: INITIAL  2.  Improve core strength and stability with patient to tolerate 30-40 min of core strengthening exercises and activities with no increase in pain  Baseline:  Goal status: INITIAL  3.  Increase core strength and stability with patient to report decreased dependence on brace/lumbar support for yard work  Baseline:  Goal status: INITIAL  4.  Improve Modified Oswestry score by 10 points  Baseline: 24/50; 48%  Goal status: INITIAL  5.  Independent in HEP including aquatic program as indicated  Baseline:  Goal status: INITIAL  PLAN:  PT FREQUENCY: 2x/week  PT DURATION: 8 weeks  PLANNED INTERVENTIONS: 97164- PT Re-evaluation, 97110-Therapeutic exercises, 97530- Therapeutic activity, 97112- Neuromuscular re-education, 97535- Self Care, [id number]- Manual therapy, [phone]- Aquatic Therapy, Patient/Family education, Taping, Dry Needling, and Joint mobilization.  PLAN FOR NEXT SESSION: Edit HEP! Hip/glute/core strengthening; progress standing hip abduction; paloff press + variations; strengthen hip rotation L > R      Alm has increased weakness L hip and LE compared to R; decreased L knee terminal knee extension; L trunk lateral flexed; R shoulder elevated; head slightly tilted L.   Lamarr GORMAN Price, PTA 11/19/2023, 4:21 PM

## 2023-11-21 ENCOUNTER — Ambulatory Visit

## 2023-11-21 DIAGNOSIS — M5459 Other low back pain: Secondary | ICD-10-CM

## 2023-11-21 DIAGNOSIS — M6281 Muscle weakness (generalized): Secondary | ICD-10-CM

## 2023-11-21 DIAGNOSIS — R29898 Other symptoms and signs involving the musculoskeletal system: Secondary | ICD-10-CM

## 2023-11-21 NOTE — Therapy (Signed)
 OUTPATIENT PHYSICAL THERAPY THORACOLUMBAR TREATMENT   Patient Name: Brian Russell MRN: [id number] DOB:[birth year], 63 y.o., male Today's Date: 11/21/2023  END OF SESSION:  PT End of Session - 11/21/23 1536     Visit Number 5    Number of Visits 16    Date for PT Re-Evaluation 12/11/23    Authorization Type [employer] [place] auth    Authorization Time Period PT 10/16/2023-12/14/2023    Authorization - Visit Number 5    Authorization - Number of Visits 7    PT Start Time 1537    PT Stop Time 1622    PT Time Calculation (min) 45 min    Activity Tolerance Patient tolerated treatment well    Behavior During Therapy [facility] for tasks assessed/performed         Past Medical History:  Diagnosis Date   Hypertension    Low thyroid  stimulating hormone (TSH) level    Past Surgical History:  Procedure Laterality Date   fractured bone     JOINT REPLACEMENT     SPINAL FUSION     Patient Active Problem List   Diagnosis Date Noted   Tinnitus of both ears 10/11/2023   Family history of sudden cardiac death in son 2023-06-05   Acute sinusitis 04/11/2022   Grief at loss of child 01/19/2022   Watering of eye 11/01/2021   Insomnia 09/12/2021   Vertigo 01/30/2021   Lumbar foraminal stenosis 08/31/2020   Bilateral shoulder pain 06/17/2020   Low back pain with sciatica 06/17/2020   Eustachian tube dysfunction 01/06/2020   Cervical spondylosis 12/10/2019   Essential hypertension 10/15/2019   Hypothyroidism 10/15/2019   HLD (hyperlipidemia) 10/15/2019   Chronic low back pain 10/15/2019   Tinea pedis 10/15/2019   Well adult exam 10/15/2019   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 Patient requests HEP be edited down to a routine he can easily implement into his daily routine.    EVAL: Patient has a history of chronic bilat LBP and pain into the  coccyx and bilat buttock for the past 30 years. Symptoms have been worsening over the past few weeks with no known cause. Symptoms come and go and he is not sure what irritates the symptoms    PERTINENT HISTORY:  HTN; low TSH; fx L clavicle and L elbow; bilat knee scopes for meniscus injuries ~ 15 yrs ago; laminectomy with spinal fusion L4/5 ~ 2013 with no significant improvement  PAIN:  Are you having pain? Yes: NPRS scale: 4-5/10; worst in the past week 9/10 Pain location: LB to coccyx to bilat buttocks  Pain description: throbbing; sometimes sharp  Aggravating factors: heavy lifting otherwise unknown cause  Relieving factors: rest; use of back brace for work in the yard   PRECAUTIONS: None  WEIGHT BEARING RESTRICTIONS: No  FALLS:  Has patient fallen in last 6 months? 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  DIAGNOSTIC FINDINGS:  MRI 10/30/20: IMPRESSION: 1. Laminectomy and fusion at L4-5 without residual or recurrent stenosis. 2. Progressive adjacent level disease with severe central canal stenosis and moderate to severe foraminal narrowing bilaterally at L3-4. 3. Stable mild subarticular and moderate foraminal stenosis bilaterally at L2-3. 4. Moderate facet hypertrophy bilaterally at L5-S1 without significant stenosis.  PATIENT SURVEYS:  Modified Oswestry 24/50; 48%    SENSATION: Sometimes feels a disconnect between the hip and knee on L LE over the past several years which seems to be increasing in frequency   MUSCLE LENGTH: Hamstrings: Right 65 deg; Left 60 deg Thomas test: tight bilat  Piriformis - tight L > R   POSTURE: rounded shoulders and forward head  PALPATION: Tenderness L lateral sacral border distal toward coccyx; tenderness with PA mobs mid lumbar   LUMBAR ROM:   AROM eval  Flexion 70% pull   Extension 30% pain bilat LB   Right lateral flexion 70% tight   Left lateral flexion 70% tight   Right rotation 20% pull R scapula  Left rotation 10% pull R scapula   (Blank rows = not tested)  LOWER EXTREMITY ROM:     Active  Right eval Left eval  Hip flexion    Hip extension    Hip abduction    Hip adduction    Hip internal rotation  Tight   Hip external rotation  Tight   Knee flexion    Knee extension    Ankle dorsiflexion    Ankle plantarflexion    Ankle inversion    Ankle eversion     (Blank rows = not tested)  LOWER EXTREMITY MMT:  decreased core strength; LE strength WFL's bilat   MMT Right eval Left eval  Hip flexion    Hip extension    Hip abduction    Hip adduction    Hip internal rotation    Hip external rotation    Knee flexion    Knee extension    Ankle dorsiflexion  Ankle plantarflexion    Ankle inversion    Ankle eversion     (Blank rows = not tested)  LUMBAR SPECIAL TESTS:  Straight leg raise test: Negative and  Slump test: Negative  FUNCTIONAL TESTS:  5 times sit to stand: 15.96 sec pulling center of sacral area  SLS; R/L 10 sec   GAIT: Distance walked: 40 feet Assistive device utilized: None Level of assistance: Complete Independence Comments: WFL's    [facility] Adult PT Treatment:                                                DATE: 11/21/2023 Therapeutic Exercise: SKTC DKTC Seated lumbar flexion stretch Bridge + hip abd with blue TB Side Lying: Clamshells + Blue TB Reverse clamshells + Red TB Straight leg hip abd + Blue TB Neuromuscular re-ed: Dead bug 90/90 heel taps SLB x30 (bil) Standing 3-way leg reach with red TB --> green TB at ankle + 1 UE support Therapeutic Activity: Standing dead lift with dowel for alignment feedback Resisted dead lift + black TB   [facility] Adult PT Treatment:                                                DATE: 11/19/2023 Therapeutic Exercise: LTR x 1 min SKTC  DKTC Figure 4 stretch --> supine bilateral  Seated figure 4 (Rt) Seated hip IR + YTB & ball b/w knees 2x10x2 Neuromuscular re-ed: 90/90 heel taps Dead bug Side Lying: Clamshells + GTB 2x10 Bent knee hip abd + GTB x5 Straight leg hip abd + GTB x10 Small leg circles in hip abd  Typewriter bridge (alt) x10 Standing lateral pelvic glide to Rt against wall 10x3 Therapeutic Activity: Squat lift mechanics --> wide stance, staggered stance   [facility] Adult PT Treatment:                                                DATE: 11/14/2023 Therapeutic Exercise: Supine: LTR x 1 min Wide leg LTR x 1 min Seated side bend QL stretch DKTC stretch Single leg stretch  Double leg stretch  Scissors Supine figure 4 stretch (press down & pull up) Neuromuscular re-ed: Side Lying: Resisted clamshells + GTB  Resisted straight leg hip abd + GTB  Leg over & back Deadbug 90/90 isometric hold --> transitioning into position with TA activation Bent over hip extension on diagonal  Therapeutic  Activity: Staggered stance with Rt foot forward (both sides) + paloff press with blue TB --> added vertical lift  Rt foot fwd staggered stance + Rt arm press down (extension) & Lt arm press out (abd) with GTB wall band     PATIENT EDUCATION:  Education details: Updated HEP  Person educated: Patient Education method: Explanation, Demonstration, Tactile cues, Verbal cues, and Handouts Education comprehension: verbalized understanding, returned demonstration, verbal cues required, tactile cues required, and needs further education  HOME EXERCISE PROGRAM: Access Code: [email or web address] URL: [email or web address] Date: 11/21/2023 Prepared by: Lamarr Price  Exercises - Supine Single Knee to Chest Stretch  - 1 x daily - 7 x weekly - 3 sets -  10 reps - Supine Double Knee to Chest  - 1 x daily - 7 x weekly - 3 sets - 10 reps - Seated Lumbar Flexion Stretch  - 1 x daily - 7 x weekly - 1 sets - 5 reps - 5-10 sec hold - Supine Dead Bug with Leg Extension  - 1 x daily - 7 x weekly - 3 sets - 10 reps - Supine 90/90 Alternating Heel Touches with Posterior Pelvic Tilt  - 1 x daily - 7 x weekly - 3 sets - 10 reps - Bridge with Hip Abduction and Resistance  - 1 x daily - 7 x weekly - 3 sets - 10 reps - Clam with Resistance  - 1 x daily - 7 x weekly - 3 sets - 10 reps - Sidelying Reverse Clamshell with Resistance  - 1 x daily - 7 x weekly - 3 sets - 10 reps - Sidelying Hip Abduction with Resistance at Thighs  - 1 x daily - 7 x weekly - 3 sets - 10 reps - Squatting Anti-Rotation Press  - 1 x daily - 7 x weekly - 3 sets - 10 reps - Standing 3-Way Leg Reach with Resistance at Ankles and Unilateral Counter Support  - 1 x daily - 7 x weekly - 3 sets - 10 reps - Deadlift with Resistance  - 1 x daily - 7 x weekly - 3 sets - 10 reps - Single Leg Stance  - 1 x daily - 7 x weekly - 1 sets - 4 reps - 30 sec hold - Right Standing Lateral Shift Correction at Wall - Repetitions  - 1 x daily - 7 x weekly - 3  sets - 10 reps  ASSESSMENT:  CLINICAL IMPRESSION: HEP updated and reviewed, providing notes on how to group exercises for morning stretches, hip and core strengthening exercises, and balance to better incorporate into patient's daily routine. Initial Lt anterior hip discomfort during 90/90 heel tap exercise decreased with repetition; instructed patient to continue monitoring symptom when performing HEP at home for possible exercise modification.   EVAL: Patient is a 63 y.o. male who was seen today for physical therapy evaluation and treatment for chronic bilat LBP with pain into the sacrum and bilat buttock. He has a history of spinal fusion L4/5 2013 with no significant change in symptoms. Patient reports increased pain in the LB and sacral area in the past few weeks. He has limited trunk and LE mobility; poor core strength and stability; muscular tightness to palpation lumbar and sacral spine; pain with functional activities constant in nature, variable in intensity. Patient will benefit from PT to address problems identified.   GOALS: Goals reviewed with patient? Yes  SHORT TERM GOALS: Target date: 11/13/2023  Independent in initial HEP  Baseline: Goal status: MET  2.  Education re-back care and body mechanics including sitting, standing, lying positions for work and home  Baseline:  Goal status: PARTIALLY MET  3.  Patient tolerates core strengthening and stabilization exercise program for 20-30 min  Baseline:  Goal status: MET   LONG TERM GOALS: Target date:  12/11/2023  Decrease frequency, intensity, and/or duration of LBP by 30-50% allowing patient to participate in work and leisure activities with less limitations  Baseline:  Goal status: INITIAL  2.  Improve core strength and stability with patient to tolerate 30-40 min of core strengthening exercises and activities with no increase in pain  Baseline:  Goal status: INITIAL  3.  Increase core strength and  stability with  patient to report decreased dependence on brace/lumbar support for yard work  Baseline:  Goal status: INITIAL  4.  Improve Modified Oswestry score by 10 points  Baseline: 24/50; 48%  Goal status: INITIAL  5.  Independent in HEP including aquatic program as indicated  Baseline:  Goal status: INITIAL  PLAN:  PT FREQUENCY: 2x/week  PT DURATION: 8 weeks  PLANNED INTERVENTIONS: 97164- PT Re-evaluation, 97110-Therapeutic exercises, 97530- Therapeutic activity, 97112- Neuromuscular re-education, 97535- Self Care, [id number]- Manual therapy, [phone]- Aquatic Therapy, Patient/Family education, Taping, Dry Needling, and Joint mobilization.  PLAN FOR NEXT SESSION: Update HEP as needed. Hip/glute/core strengthening; progress standing hip abduction; strengthen hip rotation L > R      Alm has increased weakness L hip and LE compared to R; decreased L knee terminal knee extension; L trunk lateral flexed; R shoulder elevated; head slightly tilted L.   Lamarr GORMAN Price, PTA 11/21/2023, 4:26 PM

## 2023-11-26 DIAGNOSIS — H9313 Tinnitus, bilateral: Secondary | ICD-10-CM | POA: Diagnosis not present

## 2023-11-26 DIAGNOSIS — H903 Sensorineural hearing loss, bilateral: Secondary | ICD-10-CM | POA: Diagnosis not present

## 2023-11-26 DIAGNOSIS — H833X3 Noise effects on inner ear, bilateral: Secondary | ICD-10-CM | POA: Diagnosis not present

## 2023-11-28 ENCOUNTER — Encounter: Admitting: Rehabilitative and Restorative Service Providers"

## 2023-11-29 ENCOUNTER — Encounter: Payer: Self-pay | Admitting: Family Medicine

## 2023-11-29 MED ORDER — HYDROCODONE-ACETAMINOPHEN 5-325 MG PO TABS: 1.0000 | ORAL_TABLET | Freq: Four times a day (QID) | ORAL | 0 refills | Status: DC | PRN

## 2023-12-05 ENCOUNTER — Encounter: Payer: Self-pay | Admitting: Rehabilitative and Restorative Service Providers"

## 2023-12-05 ENCOUNTER — Ambulatory Visit: Admitting: Rehabilitative and Restorative Service Providers"

## 2023-12-05 DIAGNOSIS — M6281 Muscle weakness (generalized): Secondary | ICD-10-CM

## 2023-12-05 DIAGNOSIS — M5459 Other low back pain: Secondary | ICD-10-CM

## 2023-12-05 DIAGNOSIS — R29898 Other symptoms and signs involving the musculoskeletal system: Secondary | ICD-10-CM

## 2023-12-05 NOTE — Therapy (Signed)
 OUTPATIENT PHYSICAL THERAPY THORACOLUMBAR TREATMENT   Patient Name: Brian Russell MRN: [id number] DOB:Dec 30, 1960, 63 y.o., male Today's Date: 12/05/2023  END OF SESSION:  PT End of Session - 12/05/23 1531     Visit Number 6    Number of Visits 16    Date for PT Re-Evaluation 12/11/23    Authorization Type [employer] [place] auth    Authorization Time Period PT 10/16/2023-12/14/2023    Authorization - Visit Number 6    Authorization - Number of Visits 7    PT Start Time 1530    PT Stop Time 1615    PT Time Calculation (min) 45 min         Past Medical History:  Diagnosis Date   Hypertension    Low thyroid  stimulating hormone (TSH) level    Past Surgical History:  Procedure Laterality Date   fractured bone     JOINT REPLACEMENT     SPINAL FUSION     Patient Active Problem List   Diagnosis Date Noted   Tinnitus of both ears 10/11/2023   Family history of sudden cardiac death in son 06/29/2023   Acute sinusitis 04/11/2022   Grief at loss of child 01/19/2022   Watering of eye 11/01/2021   Insomnia 09/12/2021   Vertigo 01/30/2021   Lumbar foraminal stenosis 08/31/2020   Bilateral shoulder pain 06/17/2020   Low back pain with sciatica 06/17/2020   Eustachian tube dysfunction 01/06/2020   Cervical spondylosis 12/10/2019   Essential hypertension 10/15/2019   Hypothyroidism 10/15/2019   HLD (hyperlipidemia) 10/15/2019   Chronic low back pain 10/15/2019   Tinea pedis 10/15/2019   Well adult exam 10/15/2019   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   Neck pain 09/25/2019    PCP: Dr Velma Ku  REFERRING PROVIDER: Dr Velma Ku  REFERRING DIAG: Chronic R sided LBP   Rationale for Evaluation and Treatment: Rehabilitation  THERAPY DIAG:  Other low back pain  Other symptoms and signs involving the musculoskeletal system  Muscle weakness (generalized)  ONSET DATE: 09/19/23  SUBJECTIVE:                                                                                                                                                                                            SUBJECTIVE STATEMENT: Patient reports that he notices some difference in the pain which is less intense and less frequent. Feels he is more flexible. Can tell he has made progress.    EVAL: Patient has a history of chronic bilat LBP and pain into the coccyx and bilat buttock for the past 30 years. Symptoms have been worsening over the  past few weeks with no known cause. Symptoms come and go and he is not sure what irritates the symptoms    PERTINENT HISTORY:  HTN; low TSH; fx L clavicle and L elbow; bilat knee scopes for meniscus injuries ~ 15 yrs ago; laminectomy with spinal fusion L4/5 ~ 2013 with no significant improvement  PAIN:  Are you having pain? Yes: NPRS scale: 0/10; worst in the past week 5/10 Pain location: LB to coccyx to bilat buttocks  Pain description: throbbing; sometimes sharp;  pain less frequent  Aggravating factors: heavy lifting otherwise unknown cause  Relieving factors: rest; use of back brace for work in the yard   PRECAUTIONS: None  WEIGHT BEARING RESTRICTIONS: No  FALLS:  Has patient fallen in last 6 months? 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  DIAGNOSTIC FINDINGS:  MRI 10/30/20: IMPRESSION: 1. Laminectomy and fusion at  L4-5 without residual or recurrent stenosis. 2. Progressive adjacent level disease with severe central canal stenosis and moderate to severe foraminal narrowing bilaterally at L3-4. 3. Stable mild subarticular and moderate foraminal stenosis bilaterally at L2-3. 4. Moderate facet hypertrophy bilaterally at L5-S1 without significant stenosis.  PATIENT SURVEYS:  Modified Oswestry 24/50; 48%    SENSATION: Sometimes feels a disconnect between the hip and knee on L LE over the past several years which seems to be increasing in frequency   MUSCLE LENGTH: Hamstrings: Right 65 deg; Left 60 deg Thomas test: tight bilat  Piriformis - tight L > R   POSTURE: rounded shoulders and forward head  PALPATION: Tenderness L lateral sacral border distal toward coccyx; tenderness with PA mobs mid lumbar   LUMBAR ROM:   AROM eval 12/05/23  Flexion 70% pull  75% pull   Extension 30% pain bilat LB  35% pull R > L   Right lateral flexion 70% tight  90%   Left lateral flexion 70% tight  95%   Right rotation 20% pull R scapula 40%  Left rotation 10% pull R scapula 40%   (Blank rows = not tested)  LOWER EXTREMITY ROM:     Active  Right eval Left eval Left  12/05/23  Hip flexion     Hip extension     Hip abduction     Hip adduction     Hip internal rotation  Tight  Decreased tightness   Hip external rotation  Tight  Decreased tightness   Knee flexion     Knee extension     Ankle dorsiflexion     Ankle plantarflexion     Ankle inversion     Ankle eversion      (Blank rows = not tested)  LOWER EXTREMITY MMT:  decreased core strength; LE strength WFL's bilat   MMT Right eval Left eval  Hip flexion    Hip extension    Hip abduction    Hip adduction  Hip internal rotation    Hip external rotation    Knee flexion    Knee extension    Ankle dorsiflexion    Ankle plantarflexion    Ankle inversion    Ankle eversion     (Blank rows = not tested)  LUMBAR SPECIAL TESTS:   Straight leg raise test: Negative and Slump test: Negative  FUNCTIONAL TESTS:  5 times sit to stand: 15.96 sec pulling center of sacral area  12/05/23: 15.88 sec no pulling or pain; no use of UE's  SLS; R/L 10 sec   GAIT: Distance walked: 40 feet Assistive device utilized: None Level of assistance: Complete Independence Comments: WFL's    [facility] Adult PT Treatment:                                                DATE: 12/05/2023 Therapeutic Exercise: Hip IR black TB at ankles ball btn knees  Neuromuscular re-ed: SLS 30 sec R/L  Therapeutic Activity: Sit to stand  and stand to sit with small range up and down hinged hip chest up Resisted dead lift + purple super band  Paloff press R/L Paloff press with shoulder flex/ext and figure 8 R/L Cable backward walking 20# x 10  Cable sidesteps 20# x 10 R/L    [facility] Adult PT Treatment:                                                DATE: 11/21/2023 Therapeutic Exercise: [facility] DKTC Seated lumbar flexion stretch Bridge + hip abd with blue TB Side Lying: Clamshells + Blue TB Reverse clamshells + Red TB Straight leg hip abd + Blue TB Neuromuscular re-ed: Dead bug 90/90 heel taps SLB x30 (bil) Standing 3-way leg reach with red TB --> green TB at ankle + 1 UE support Therapeutic Activity: Standing dead lift with dowel for alignment feedback Resisted dead lift + black TB   [facility] Adult PT Treatment:                                                DATE: 11/19/2023 Therapeutic Exercise: LTR x 1 min SKTC  DKTC Figure 4 stretch --> supine bilateral  Seated figure 4 (Rt) Seated hip IR + YTB & ball b/w knees 2x10x2 Neuromuscular re-ed: 90/90 heel taps Dead bug Side Lying: Clamshells + GTB 2x10 Bent knee hip abd + GTB x5 Straight leg hip abd + GTB x10 Small leg circles in hip abd  Typewriter bridge (alt) x10 Standing lateral pelvic glide to Rt against wall 10x3 Therapeutic Activity: Squat lift mechanics --> wide stance, staggered  stance    PATIENT EDUCATION:  Education details: Updated HEP  Person educated: Patient Education method: Explanation, Demonstration, Tactile cues, Verbal cues, and Handouts Education comprehension: verbalized understanding, returned demonstration, verbal cues required, tactile cues required, and needs further education  HOME EXERCISE PROGRAM: Access Code: [email or web address] URL: [email or web address] Date: 11/21/2023 Prepared by: Lamarr Price  Exercises - Supine Single Knee to Chest Stretch  - 1 x daily - 7 x weekly - 3 sets - 10 reps - Supine Double Knee to  Chest  - 1 x daily - 7 x weekly - 3 sets - 10 reps - Seated Lumbar Flexion Stretch  - 1 x daily - 7 x weekly - 1 sets - 5 reps - 5-10 sec hold - Supine Dead Bug with Leg Extension  - 1 x daily - 7 x weekly - 3 sets - 10 reps - Supine 90/90 Alternating Heel Touches with Posterior Pelvic Tilt  - 1 x daily - 7 x weekly - 3 sets - 10 reps - Bridge with Hip Abduction and Resistance  - 1 x daily - 7 x weekly - 3 sets - 10 reps - Clam with Resistance  - 1 x daily - 7 x weekly - 3 sets - 10 reps - Sidelying Reverse Clamshell with Resistance  - 1 x daily - 7 x weekly - 3 sets - 10 reps - Sidelying Hip Abduction with Resistance at Thighs  - 1 x daily - 7 x weekly - 3 sets - 10 reps - Squatting Anti-Rotation Press  - 1 x daily - 7 x weekly - 3 sets - 10 reps - Standing 3-Way Leg Reach with Resistance at Ankles and Unilateral Counter Support  - 1 x daily - 7 x weekly - 3 sets - 10 reps - Deadlift with Resistance  - 1 x daily - 7 x weekly - 3 sets - 10 reps - Single Leg Stance  - 1 x daily - 7 x weekly - 1 sets - 4 reps - 30 sec hold - Right Standing Lateral Shift Correction at Wall - Repetitions  - 1 x daily - 7 x weekly - 3 sets - 10 reps  ASSESSMENT:  CLINICAL IMPRESSION: Re-evaluation demonstrates improved trunk and LE mobility and ROM. He is tolerating 30-40 min of core stabilization. He has accomplished short term goals and is  progressing well toward stated long term goals of therapy. Continued with core and LE strengthening. Patient will benefit from continued treatment to achieve goals and reach maximum rehab potential. One additional appointment approved.will request additional visits next appointment.    EVAL: Patient is a 63 y.o. male who was seen today for physical therapy evaluation and treatment for chronic bilat LBP with pain into the sacrum and bilat buttock. He has a history of spinal fusion L4/5 2013 with no significant change in symptoms. Patient reports increased pain in the LB and sacral area in the past few weeks. He has limited trunk and LE mobility; poor core strength and stability; muscular tightness to palpation lumbar and sacral spine; pain with functional activities constant in nature, variable in intensity. Patient will benefit from PT to address problems identified.   GOALS: Goals reviewed with patient? Yes  SHORT TERM GOALS: Target date: 11/13/2023  Independent in initial HEP  Baseline: Goal status: MET  2.  Education re-back care and body mechanics including sitting, standing, lying positions for work and home  Baseline:  Goal status:  MET  3.  Patient tolerates core strengthening and stabilization exercise program for 20-30 min  Baseline:  Goal status: MET   LONG TERM GOALS: Target date:  12/11/2023  Decrease frequency, intensity, and/or duration of LBP by 30-50% allowing patient to participate in work and leisure activities with less limitations  Baseline:  Goal status: INITIAL  2.  Improve core strength and stability with patient to tolerate 30-40 min of core strengthening exercises and activities with no increase in pain  Baseline:  Goal status: MET  3.  Increase core strength  and stability with patient to report decreased dependence on brace/lumbar support for yard work  Baseline:  Goal status: INITIAL  4.  Improve Modified Oswestry score by 10 points  Baseline: 24/50; 48%   Goal status: INITIAL  5.  Independent in HEP including aquatic program as indicated  Baseline:  Goal status: INITIAL  PLAN:  PT FREQUENCY: 2x/week  PT DURATION: 8 weeks  PLANNED INTERVENTIONS: 97164- PT Re-evaluation, 97110-Therapeutic exercises, 97530- Therapeutic activity, 97112- Neuromuscular re-education, 97535- Self Care, [id number]- Manual therapy, [phone]- Aquatic Therapy, Patient/Family education, Taping, Dry Needling, and Joint mobilization.  PLAN FOR NEXT SESSION: Update HEP as needed. Hip/glute/core strengthening; progress standing hip abduction; strengthen hip rotation L > R      Alm has increased weakness L hip and LE compared to R; decreased L knee terminal knee extension; L trunk lateral flexed; R shoulder elevated; head slightly tilted L.   Tamela Elsayed P Taran Haynesworth, PT 12/05/2023, 3:32 PM

## 2023-12-12 ENCOUNTER — Encounter: Payer: Self-pay | Admitting: Rehabilitative and Restorative Service Providers"

## 2023-12-12 ENCOUNTER — Ambulatory Visit: Admitting: Rehabilitative and Restorative Service Providers"

## 2023-12-12 DIAGNOSIS — M6281 Muscle weakness (generalized): Secondary | ICD-10-CM

## 2023-12-12 DIAGNOSIS — M5459 Other low back pain: Secondary | ICD-10-CM

## 2023-12-12 DIAGNOSIS — R29898 Other symptoms and signs involving the musculoskeletal system: Secondary | ICD-10-CM | POA: Diagnosis not present

## 2023-12-12 NOTE — Therapy (Signed)
 OUTPATIENT PHYSICAL THERAPY THORACOLUMBAR TREATMENT   Patient Name: Brian Russell MRN: [id number] DOB:May 02, 1961, 63 y.o., male Today's Date: 12/12/2023  END OF SESSION:  PT End of Session - 12/12/23 1544     Visit Number 7    Number of Visits 13    Date for PT Re-Evaluation 01/23/24    Authorization Type [employer] [place] auth    Authorization Time Period PT 10/16/2023-12/14/2023    Authorization - Visit Number 7    Authorization - Number of Visits 7    PT Start Time 1535    PT Stop Time 1620    PT Time Calculation (min) 45 min    Activity Tolerance Patient tolerated treatment well         Past Medical History:  Diagnosis Date   Hypertension    Low thyroid  stimulating hormone (TSH) level    Past Surgical History:  Procedure Laterality Date   fractured bone     JOINT REPLACEMENT     SPINAL FUSION     Patient Active Problem List   Diagnosis Date Noted   Tinnitus of both ears 10/11/2023   Family history of sudden cardiac death in son June 30, 2023   Acute sinusitis 04/11/2022   Grief at loss of child 01/19/2022   Watering of eye 11/01/2021   Insomnia 09/12/2021   Vertigo 01/30/2021   Lumbar foraminal stenosis 08/31/2020   Bilateral shoulder pain 06/17/2020   Low back pain with sciatica 06/17/2020   Eustachian tube dysfunction 01/06/2020   Cervical spondylosis 12/10/2019   Essential hypertension 10/15/2019   Hypothyroidism 10/15/2019   HLD (hyperlipidemia) 10/15/2019   Chronic low back pain 10/15/2019   Tinea pedis 10/15/2019   Well adult exam 10/15/2019   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 Drove to [place]  for a funeral. Had increased back pain for the following day. He notices some difference in the pain which is less intense and less frequent. Feels he is more flexible. Can tell he has made progress.  EVAL: Patient has a history of chronic bilat LBP and pain into the coccyx and bilat buttock for the past 30 years. Symptoms have been worsening over the past few weeks with no known cause. Symptoms come and go and he is not sure what irritates the symptoms    PERTINENT HISTORY:  HTN; low TSH; fx L clavicle and L elbow; bilat knee scopes for meniscus injuries ~ 15 yrs ago; laminectomy with spinal fusion L4/5 ~ 2013 with no significant improvement  PAIN:  Are you having pain? Yes: NPRS scale: 0/10; worst in the past week 5/10 Pain location: LB to coccyx to bilat buttocks  Pain description: throbbing; sometimes sharp;  pain less frequent  Aggravating factors: heavy lifting otherwise unknown cause  Relieving factors: rest; use of back brace for work in the yard   PRECAUTIONS: None  WEIGHT BEARING RESTRICTIONS: No  FALLS:  Has patient fallen in last 6 months? 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  DIAGNOSTIC FINDINGS:  MRI 10/30/20: IMPRESSION: 1. Laminectomy and fusion at L4-5 without residual or recurrent stenosis. 2. Progressive adjacent level disease with severe central canal stenosis and moderate to severe foraminal narrowing bilaterally at L3-4. 3. Stable mild subarticular and moderate foraminal stenosis bilaterally at L2-3. 4. Moderate facet hypertrophy bilaterally at L5-S1 without significant stenosis.  PATIENT SURVEYS:  Modified Oswestry 24/50; 48%  Modified Oswestry 6/50; 12%    SENSATION: Sometimes feels a disconnect between the hip and knee on L LE over the past several years which seems to be increasing in frequency   MUSCLE LENGTH: Hamstrings: Right 65 deg; Left 60 deg Thomas test: tight bilat  Piriformis - tight L > R   POSTURE: Alm has increased weakness L hip and LE compared to R; decreased L knee terminal knee extension; L trunk lateral flexed; R shoulder elevated; head slightly tilted L.  PALPATION: Tenderness L lateral sacral border distal toward coccyx; tenderness with PA mobs mid lumbar   LUMBAR ROM: 12/12/23: decreased pain and improved mobility/ROM noted through the lumbar spine   [facility] eval 12/12/23  Flexion 70% pull  75% pull   Extension 30% pain bilat LB  35% pull R > L   Right lateral flexion 70% tight  90%   Left lateral flexion 70% tight  95%   Right rotation 20% pull R scapula 40%  Left rotation 10% pull R scapula 40%   (Blank rows = not tested)  LOWER EXTREMITY ROM:     Active  Right eval Left eval Left  12/05/23  Hip flexion     Hip extension     Hip abduction     Hip adduction     Hip internal rotation  Tight  Decreased tightness   Hip external rotation  Tight  Decreased tightness   Knee flexion     Knee extension     Ankle dorsiflexion  Ankle plantarflexion     Ankle inversion     Ankle eversion      (Blank rows = not  tested)  LOWER EXTREMITY MMT:  decreased core strength; LE strength WFL's bilat    12/12/23: improving core strength and stability with patient demonstrating improved spinal alignment with functional activities and exercises   L LE strength and stability improving with functional activities   MMT Right eval Left eval  Hip flexion    Hip extension    Hip abduction    Hip adduction    Hip internal rotation    Hip external rotation    Knee flexion    Knee extension    Ankle dorsiflexion    Ankle plantarflexion    Ankle inversion    Ankle eversion     (Blank rows = not tested)  LUMBAR SPECIAL TESTS:  Straight leg raise test: Negative and Slump test: Negative  FUNCTIONAL TESTS:  5 times sit to stand: 15.96 sec pulling center of sacral area  12/12/23: 15.88 sec no pulling or pain; no use of UE's  SLS; R/L 10+ sec   GAIT: Distance walked: 40 feet Assistive device utilized: None Level of assistance: Complete Independence Comments: WFL's    [facility] Adult PT Treatment:                                                DATE: 12/12/2023 Therapeutic Exercise: Hip IR black TB at ankles ball btn knees bilat x 15; L x 10; R x 10; bilat x 10  Side steps black TB at ankles R/L lead with heels 10 feet x 4 x R/L  Neuromuscular re-ed: SLS 30 sec R/L  Diver x 10 R/L  Therapeutic Activity: Counter plank 30/60/60 sec  Counter push up x 5 to observe technique Sit to stand  and stand to sit with small range up and down hinged hip chest up focus on the lumbar curve Resisted dead lift + purple super band  Paloff press R/L Paloff press with shoulder flex/ext and figure 8 R/L Cable backward walking 20# x 10  Cable sidesteps 20# x 10 R/L    [facility] Adult PT Treatment:                                                DATE: 12/05/2023 Therapeutic Exercise: Hip IR black TB at ankles ball btn knees  Neuromuscular re-ed: SLS 30 sec R/L  Therapeutic Activity: Sit to stand  and stand to sit with small range  up and down hinged hip chest up Resisted dead lift + purple super band  Paloff press R/L Paloff press with shoulder flex/ext and figure 8 R/L Cable backward walking 20# x 10  Cable sidesteps 20# x 10 R/L    [facility] Adult PT Treatment:                                                DATE: 11/21/2023 Therapeutic Exercise: [facility] Seated lumbar flexion stretch Bridge + hip abd with blue TB Side Lying: Clamshells + Blue TB Reverse clamshells + Red TB  Straight leg hip abd + Blue TB Neuromuscular re-ed: Dead bug 90/90 heel taps SLB x30 (bil) Standing 3-way leg reach with red TB --> green TB at ankle + 1 UE support Therapeutic Activity: Standing dead lift with dowel for alignment feedback Resisted dead lift + black TB   [facility] Adult PT Treatment:                                                DATE: 11/19/2023 Therapeutic Exercise: LTR x 1 min [facility]  DKTC Figure 4 stretch --> supine bilateral  Seated figure 4 (Rt) Seated hip IR + YTB & ball b/w knees 2x10x2 Neuromuscular re-ed: 90/90 heel taps Dead bug Side Lying: Clamshells + GTB 2x10 Bent knee hip abd + GTB x5 Straight leg hip abd + GTB x10 Small leg circles in hip abd  Typewriter bridge (alt) x10 Standing lateral pelvic glide to Rt against wall 10x3 Therapeutic Activity: Squat lift mechanics --> wide stance, staggered stance    PATIENT EDUCATION:  Education details: Updated HEP  Person educated: Patient Education method: Explanation, Demonstration, Tactile cues, Verbal cues, and Handouts Education comprehension: verbalized understanding, returned demonstration, verbal cues required, tactile cues required, and needs further education  HOME EXERCISE PROGRAM: Access Code: [email or web address] URL: [email or web address] Date: 12/12/2023 Prepared by: Gabriana Wilmott  Exercises - Supine Single Knee to Chest Stretch  - 1 x daily - 7 x weekly - 3 sets - 10 reps - Supine Double Knee to Chest  - 1 x daily - 7 x weekly - 3  sets - 10 reps - Seated Lumbar Flexion Stretch  - 1 x daily - 7 x weekly - 1 sets - 5 reps - 5-10 sec hold - Supine Dead Bug with Leg Extension  - 1 x daily - 7 x weekly - 3 sets - 10 reps - Supine 90/90 Alternating Heel Touches with Posterior Pelvic Tilt  - 1 x daily - 7 x weekly - 3 sets - 10 reps - Bridge with Hip Abduction and Resistance  - 1 x daily - 7 x weekly - 3 sets - 10 reps - Clam with Resistance  - 1 x daily - 7 x weekly - 3 sets - 10 reps - Sidelying Reverse Clamshell with Resistance  - 1 x daily - 7 x weekly - 3 sets - 10 reps - Sidelying Hip Abduction with Resistance at Thighs  - 1 x daily - 7 x weekly - 3 sets - 10 reps - Squatting Anti-Rotation Press  - 1 x daily - 7 x weekly - 3 sets - 10 reps - Standing 3-Way Leg Reach with Resistance at Ankles and Unilateral Counter Support  - 1 x daily - 7 x weekly - 3 sets - 10 reps - Deadlift with Resistance  - 1 x daily - 7 x weekly - 3 sets - 10 reps - Single Leg Stance  - 1 x daily - 7 x weekly - 1 sets - 4 reps - 30 sec hold - Right Standing Lateral Shift Correction at Wall - Repetitions  - 1 x daily - 7 x weekly - 3 sets - 10 reps - The Diver  - 2 x daily - 7 x weekly - 1 sets - 10 reps - 2-3 sec  hold - Plank on Counter  - 2 x daily - 7 x weekly - 1  sets - 3 reps - 30 sec  hold  ASSESSMENT:  CLINICAL IMPRESSION: Alm continues work on core and LE strengthening. Alm has weakness L hip and LE compared to R; decreased L knee terminal knee extension; L trunk lateral flexed; R shoulder elevated; head slightly tilted L. He has accomplished short term goals and long term goal for improving modified Oswestry score and tolerating 30-40 min of exercise. He progressing well toward remaining long term goals of therapy. Continued with core and LE strengthening. Patient will benefit from continued treatment to achieve goals and reach maximum rehab potential. Will plan to decrease frequency to 1x/wk to progress strengthening and stabilization and  move toward independent HEP.   EVAL: Patient is a 63 y.o. male who was seen today for physical therapy evaluation and treatment for chronic bilat LBP with pain into the sacrum and bilat buttock. He has a history of spinal fusion L4/5 2013 with no significant change in symptoms. Patient reports increased pain in the LB and sacral area in the past few weeks. He has limited trunk and LE mobility; poor core strength and stability; muscular tightness to palpation lumbar and sacral spine; pain with functional activities constant in nature, variable in intensity. Patient will benefit from PT to address problems identified.   GOALS: Goals reviewed with patient? Yes  SHORT TERM GOALS: Target date: 11/13/2023  Independent in initial HEP  Baseline: Goal status: MET  2.  Education re-back care and body mechanics including sitting, standing, lying positions for work and home  Baseline:  Goal status:  MET  3.  Patient tolerates core strengthening and stabilization exercise program for 20-30 min  Baseline:  Goal status: MET   LONG TERM GOALS: Target date:  01/23/2024   Decrease frequency, intensity, and/or duration of LBP by 30-50% allowing patient to participate in work and leisure activities with less limitations  Baseline:  Goal status: partially met   2.  Improve core strength and stability with patient to tolerate 30-40 min of core strengthening exercises and activities with no increase in pain  Baseline:  Goal status: MET  3.  Increase core strength and stability with patient to report decreased dependence on brace/lumbar support for yard work  Baseline:  Goal status: on going   4.  Improve Modified Oswestry score by 10 points  Baseline: 24/50; 48%  12/12/23: 6/50; 12%  Goal status: met  5.  Independent in HEP including aquatic program as indicated  Baseline:  Goal status: on going   PLAN:  PT FREQUENCY: 1x/week  PT DURATION: 6 weeks   PLANNED INTERVENTIONS: [id number]- PT  Re-evaluation, 97110-Therapeutic exercises, 97530- Therapeutic activity, 97112- Neuromuscular re-education, 97535- Self Care, [id number]- Manual therapy, [phone]- Aquatic Therapy, Patient/Family education, Taping, Dry Needling, and Joint mobilization.  PLAN FOR NEXT SESSION:  Hip/glute/core strengthening; strengthen hip rotation and functional gluts L > R; progress to independent home program        [employer], PT 12/12/2023, 4:50 PM

## 2023-12-16 ENCOUNTER — Other Ambulatory Visit: Payer: Self-pay | Admitting: Family Medicine

## 2023-12-19 ENCOUNTER — Encounter: Payer: Self-pay | Admitting: *Deleted

## 2023-12-20 ENCOUNTER — Ambulatory Visit: Attending: Family Medicine | Admitting: Rehabilitative and Restorative Service Providers"

## 2023-12-20 ENCOUNTER — Encounter: Payer: Self-pay | Admitting: Rehabilitative and Restorative Service Providers"

## 2023-12-20 DIAGNOSIS — R29898 Other symptoms and signs involving the musculoskeletal system: Secondary | ICD-10-CM | POA: Diagnosis not present

## 2023-12-20 DIAGNOSIS — M5459 Other low back pain: Secondary | ICD-10-CM | POA: Diagnosis not present

## 2023-12-20 DIAGNOSIS — M6281 Muscle weakness (generalized): Secondary | ICD-10-CM | POA: Diagnosis not present

## 2023-12-20 NOTE — Therapy (Signed)
 OUTPATIENT PHYSICAL THERAPY THORACOLUMBAR TREATMENT   Patient Name: Brian Russell MRN: [id number] DOB:Sep 15, 1960, 63 y.o., male Today's Date: 12/20/2023  END OF SESSION:  PT End of Session - 12/20/23 1617     Visit Number 8    Number of Visits 11    Date for PT Re-Evaluation 01/23/24    Authorization Type [employer] [place] auth    Authorization Time Period PT 10/16/2023-01/11/2024    Authorization - Visit Number 8    Authorization - Number of Visits 11         Past Medical History:  Diagnosis Date   Hypertension    Low thyroid  stimulating hormone (TSH) level    Past Surgical History:  Procedure Laterality Date   fractured bone     JOINT REPLACEMENT     SPINAL FUSION     Patient Active Problem List   Diagnosis Date Noted   Tinnitus of both ears 10/11/2023   Family history of sudden cardiac death in son 06-10-23   Acute sinusitis 04/11/2022   Grief at loss of child 01/19/2022   Watering of eye 11/01/2021   Insomnia 09/12/2021   Vertigo 01/30/2021   Lumbar foraminal stenosis 08/31/2020   Bilateral shoulder pain 06/17/2020   Low back pain with sciatica 06/17/2020   Eustachian tube dysfunction 01/06/2020   Cervical spondylosis 12/10/2019   Essential hypertension 10/15/2019   Hypothyroidism 10/15/2019   HLD (hyperlipidemia) 10/15/2019   Chronic low back pain 10/15/2019   Tinea pedis 10/15/2019   Well adult exam 10/15/2019   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 He can tell he is getting looser. He notices some difference in the pain which is less intense and less frequent. Feels he is more flexible.    EVAL: Patient has a history of chronic bilat LBP and pain into the coccyx and bilat buttock for the past 30 years. Symptoms have been worsening over the past few weeks with no known cause. Symptoms come and go and he is not sure what  irritates the symptoms    PERTINENT HISTORY:  HTN; low TSH; fx L clavicle and L elbow; bilat knee scopes for meniscus injuries ~ 15 yrs ago; laminectomy with spinal fusion L4/5 ~ 2013 with no significant improvement  PAIN:  Are you having pain? Yes: NPRS scale: 0/10; worst in the past week 5/10 Pain location: LB to coccyx to bilat buttocks  Pain description: throbbing; sometimes sharp;  pain less frequent  Aggravating factors: heavy lifting otherwise unknown cause  Relieving factors: rest; use of back brace for work in the yard   PRECAUTIONS: None  WEIGHT BEARING RESTRICTIONS: No  FALLS:  Has patient fallen in last 6 months? 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  DIAGNOSTIC FINDINGS:  MRI 10/30/20: IMPRESSION: 1. Laminectomy and fusion at L4-5 without residual or recurrent stenosis. 2.  Progressive adjacent level disease with severe central canal stenosis and moderate to severe foraminal narrowing bilaterally at L3-4. 3. Stable mild subarticular and moderate foraminal stenosis bilaterally at L2-3. 4. Moderate facet hypertrophy bilaterally at L5-S1 without significant stenosis.  PATIENT SURVEYS:  Modified Oswestry 24/50; 48%  Modified Oswestry 6/50; 12%    SENSATION: Sometimes feels a disconnect between the hip and knee on L LE over the past several years which seems to be increasing in frequency   MUSCLE LENGTH: Hamstrings: Right 65 deg; Left 60 deg Thomas test: tight bilat  Piriformis - tight L > R   POSTURE: Alm has increased weakness L hip and LE compared to R; decreased L knee terminal knee extension; L trunk lateral flexed; R shoulder elevated; head slightly tilted L.  PALPATION: Tenderness L lateral sacral border distal toward coccyx; tenderness with PA mobs mid lumbar   LUMBAR ROM: 12/12/23: decreased pain and improved mobility/ROM noted through the lumbar spine   AROM eval 12/12/23  Flexion 70% pull  75% pull   Extension 30% pain bilat LB  35% pull R > L   Right lateral flexion 70% tight  90%   Left lateral flexion 70% tight  95%   Right rotation 20% pull R scapula 40%  Left rotation 10% pull R scapula 40%   (Blank rows = not tested)  LOWER EXTREMITY ROM:     Active  Right eval Left eval Left  12/05/23  Hip flexion     Hip extension     Hip abduction     Hip adduction     Hip internal rotation  Tight  Decreased tightness   Hip external rotation  Tight  Decreased tightness   Knee flexion     Knee extension     Ankle dorsiflexion     Ankle plantarflexion     Ankle inversion     Ankle eversion      (Blank rows = not tested)  LOWER EXTREMITY MMT:  decreased core strength; LE strength WFL's bilat    12/12/23: improving core  strength and stability with patient demonstrating improved spinal alignment with functional activities and  exercises   L LE strength and stability improving with functional activities   MMT Right eval Left eval  Hip flexion    Hip extension    Hip abduction    Hip adduction    Hip internal rotation    Hip external rotation    Knee flexion    Knee extension    Ankle dorsiflexion    Ankle plantarflexion    Ankle inversion    Ankle eversion     (Blank rows = not tested)  LUMBAR SPECIAL TESTS:  Straight leg raise test: Negative and Slump test: Negative  FUNCTIONAL TESTS:  5 times sit to stand: 15.96 sec pulling center of sacral area  12/12/23: 15.88 sec no pulling or pain; no use of UE's  SLS; R/L 10+ sec   GAIT: Distance walked: 40 feet Assistive device utilized: None Level of assistance: Complete Independence Comments: WFL's    [facility] Adult PT Treatment:                                                DATE: 12/20/2023 Therapeutic Exercise: Bridge with LE extension lifting R x 5; L x 10  Hooklying shoulder flexion with black TB btn hands x 10 x 2  90/90 heel tap with core engaged x 10 x 2  Hip IR black TB at ankles ball btn knees bilat x 15; L x 10; R x 10; bilat x 10  Side steps black TB at ankles R/L lead with heels 10 feet x 4 x R/L  Neuromuscular re-ed: SLS 30 sec R/L  Diver x 5 x 2 R/L  Therapeutic Activity: Counter plank   Counter plank with variation - lifting one leg into extension alternating x 10 R/L  Counter plank with variation - lifting one arm into shoulder flexion alternating x 10 R/L Counter push up x 5 to observe technique Sit to stand and stand to sit with small range up and down hinged hip chest up focus on the lumbar curve Resisted dead lift + green super band 45-120 Paloff press R/L Paloff press with shoulder flex/ext and figure 8 R/L Cable backward walking 20# x 10  Cable sidesteps 20# x 10 R/L   [facility] Adult PT Treatment:                                                DATE: 12/12/2023 Therapeutic Exercise: Hip IR black TB at ankles ball btn knees bilat  x 15; L x 10; R x 10; bilat x 10  Side steps black TB at ankles R/L lead with heels 10 feet x 4 x R/L  Neuromuscular re-ed: SLS 30 sec R/L  Diver x 10 R/L  Therapeutic Activity: Counter plank 30/60/60 sec  Counter push up x 5 to observe technique Sit to stand  and stand to sit with small range up and down hinged hip chest up focus on the lumbar curve Resisted dead lift + purple super band  Paloff press R/L Paloff press with shoulder flex/ext and figure 8 R/L Cable backward walking 20# x 10  Cable sidesteps 20# x 10 R/L    [facility] Adult PT Treatment:  DATE: 12/05/2023 Therapeutic Exercise: Hip IR black TB at ankles ball btn knees  Neuromuscular re-ed: SLS 30 sec R/L  Therapeutic Activity: Sit to stand  and stand to sit with small range up and down hinged hip chest up Resisted dead lift + purple super band  Paloff press R/L Paloff press with shoulder flex/ext and figure 8 R/L Cable backward walking 20# x 10  Cable sidesteps 20# x 10 R/L    PATIENT EDUCATION:  Education details: Updated HEP  Person educated: Patient Education method: Explanation, Demonstration, Tactile cues, Verbal cues, and Handouts Education comprehension: verbalized understanding, returned demonstration, verbal cues required, tactile cues required, and needs further education  HOME EXERCISE PROGRAM: Access Code: [email or web address] URL: [email or web address] Date: 12/12/2023 Prepared by: Sair Faulcon  Exercises - Supine Single Knee to Chest Stretch  - 1 x daily - 7 x weekly - 3 sets - 10 reps - Supine Double Knee to Chest  - 1 x daily - 7 x weekly - 3 sets - 10 reps - Seated Lumbar Flexion Stretch  - 1 x daily - 7 x weekly - 1 sets - 5 reps - 5-10 sec hold - Supine Dead Bug with Leg Extension  - 1 x daily - 7 x weekly - 3 sets - 10 reps - Supine 90/90 Alternating Heel Touches with Posterior Pelvic Tilt  - 1 x daily - 7 x weekly - 3 sets - 10 reps - Bridge with  Hip Abduction and Resistance  - 1 x daily - 7 x weekly - 3 sets - 10 reps - Clam with Resistance  - 1 x daily - 7 x weekly - 3 sets - 10 reps - Sidelying Reverse Clamshell with Resistance  - 1 x daily - 7 x weekly - 3 sets - 10 reps - Sidelying Hip Abduction with Resistance at Thighs  - 1 x daily - 7 x weekly - 3 sets - 10 reps - Squatting Anti-Rotation Press  - 1 x daily - 7 x weekly - 3 sets - 10 reps - Standing 3-Way Leg Reach with Resistance at Ankles and Unilateral Counter Support  - 1 x daily - 7 x weekly - 3 sets - 10 reps - Deadlift with Resistance  - 1 x daily - 7 x weekly - 3 sets - 10 reps - Single Leg Stance  - 1 x daily - 7 x weekly - 1 sets - 4 reps - 30 sec hold - Right Standing Lateral Shift Correction at Wall - Repetitions  - 1 x daily - 7 x weekly - 3 sets - 10 reps - The Diver  - 2 x daily - 7 x weekly - 1 sets - 10 reps - 2-3 sec  hold - Plank on Counter  - 2 x daily - 7 x weekly - 1 sets - 3 reps - 30 sec  hold  ASSESSMENT:  CLINICAL IMPRESSION: Continued work on core stabilization and strengthening as well as LE strengthening focus on L LE to improved strength in L to closer to strength in R LE. Alm has some L knee pain and fatigue but symptoms resolve by the next day with rest.     Re-evaluation: Alm continues work on core and LE strengthening. Persistent weakness L hip and LE compared to R; decreased L knee terminal knee extension; L trunk lateral flexed; R shoulder elevated; head slightly tilted L. He has accomplished short term goals and long term goal for improving modified Oswestry score and tolerating 30-40 min  of exercise. He progressing well toward remaining long term goals of therapy. Continued with core and LE strengthening. Patient will benefit from continued treatment to achieve goals and reach maximum rehab potential. Will plan to decrease frequency to 1x/wk to progress strengthening and stabilization and move toward independent HEP.   EVAL: Patient is a 63  y.o. male who was seen today for physical therapy evaluation and treatment for chronic bilat LBP with pain into the sacrum and bilat buttock. He has a history of spinal fusion L4/5 2013 with no significant change in symptoms. Patient reports increased pain in the LB and sacral area in the past few weeks. He has limited trunk and LE mobility; poor core strength and stability; muscular tightness to palpation lumbar and sacral spine; pain with functional activities constant in nature, variable in intensity. Patient will benefit from PT to address problems identified.   GOALS: Goals reviewed with patient? Yes  SHORT TERM GOALS: Target date: 11/13/2023  Independent in initial HEP  Baseline: Goal status: MET  2.  Education re-back care and body mechanics including sitting, standing, lying positions for work and home  Baseline:  Goal status:  MET  3.  Patient tolerates core strengthening and stabilization exercise program for 20-30 min  Baseline:  Goal status: MET   LONG TERM GOALS: Target date:  01/23/2024   Decrease frequency, intensity, and/or duration of LBP by 30-50% allowing patient to participate in work and leisure activities with less limitations  Baseline:  Goal status: partially met   2.  Improve core strength and stability with patient to tolerate 30-40 min of core strengthening exercises and activities with no increase in pain  Baseline:  Goal status: MET  3.  Increase core strength and stability with patient to report decreased dependence on brace/lumbar support for yard work  Baseline:  Goal status: on going   4.  Improve Modified Oswestry score by 10 points  Baseline: 24/50; 48%  12/12/23: 6/50; 12%  Goal status: met  5.  Independent in HEP including aquatic program as indicated  Baseline:  Goal status: on going   PLAN:  PT FREQUENCY: 1x/week  PT DURATION: 6 weeks   PLANNED INTERVENTIONS: 97164- PT Re-evaluation, 97110-Therapeutic exercises, 97530- Therapeutic  activity, 97112- Neuromuscular re-education, 97535- Self Care, [id number]- Manual therapy, [phone]- Aquatic Therapy, Patient/Family education, Taping, Dry Needling, and Joint mobilization.  PLAN FOR NEXT SESSION:  Hip/glute/core strengthening; strengthen hip rotation and functional gluts L > R; progress to independent home program   Try lateral heel tap on step; cross chest hand tap in counter plank position      [employer], PT 12/20/2023, 5:05 PM

## 2023-12-26 ENCOUNTER — Encounter: Payer: Self-pay | Admitting: Rehabilitative and Restorative Service Providers"

## 2023-12-26 ENCOUNTER — Ambulatory Visit: Admitting: Rehabilitative and Restorative Service Providers"

## 2023-12-26 DIAGNOSIS — M6281 Muscle weakness (generalized): Secondary | ICD-10-CM | POA: Diagnosis not present

## 2023-12-26 DIAGNOSIS — R29898 Other symptoms and signs involving the musculoskeletal system: Secondary | ICD-10-CM

## 2023-12-26 DIAGNOSIS — M5459 Other low back pain: Secondary | ICD-10-CM | POA: Diagnosis not present

## 2023-12-26 NOTE — Therapy (Addendum)
 OUTPATIENT PHYSICAL THERAPY THORACOLUMBAR TREATMENT PHYSICAL THERAPY DISCHARGE SUMMARY  Visits from Start of Care: 9  Current functional level related to goals / functional outcomes: See progress notes for discharge status   Remaining deficits: Needs to continue HEP     Education / Equipment: HEP   Patient agrees to discharge. Patient goals were met. Patient is being discharged due to being pleased with the current functional level.  Rama Sorci P. Ina PT, MPH 01/23/24 9:00 AM     Patient Name: Brian Russell MRN: [id number] DOB:[birth year], 63 y.o., male Today's Date: 12/26/2023  END OF SESSION:  PT End of Session - 12/26/23 1534     Visit Number 9    Number of Visits 11    Date for PT Re-Evaluation 01/23/24    Authorization Type [employer] of [place] auth    Authorization Time Period PT 10/16/2023-01/11/2024    Authorization - Visit Number 9    Authorization - Number of Visits 11    PT Start Time 1530    PT Stop Time 1615    PT Time Calculation (min) 45 min         Past Medical History:  Diagnosis Date   Hypertension    Low thyroid  stimulating hormone (TSH) level    Past Surgical History:  Procedure Laterality Date   fractured bone     JOINT REPLACEMENT     SPINAL FUSION     Patient Active Problem List   Diagnosis Date Noted   Tinnitus of both ears 10/11/2023   Family history of sudden cardiac death in son 06/23/23   Acute sinusitis 04/11/2022   Grief at loss of child 01/19/2022   Watering of eye 11/01/2021   Insomnia 09/12/2021   Vertigo 01/30/2021   Lumbar foraminal stenosis 08/31/2020   Bilateral shoulder pain 06/17/2020   Low back pain with sciatica 06/17/2020   Eustachian tube dysfunction 01/06/2020   Cervical spondylosis 12/10/2019   Essential hypertension 10/15/2019   Hypothyroidism 10/15/2019   HLD (hyperlipidemia) 10/15/2019   Chronic low back pain 10/15/2019   Tinea pedis 10/15/2019   Well adult exam 10/15/2019   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 Can tell he is improving and feeling looser. Pain which is less intense and less frequent.    EVAL: Patient has a history of chronic bilat LBP and pain into the coccyx and bilat buttock for the past 30 years. Symptoms have been worsening over the past few weeks with no known cause. Symptoms come and go and he is not sure what irritates the symptoms    PERTINENT HISTORY:  HTN; low TSH; fx L clavicle and L elbow; bilat knee scopes for meniscus injuries ~ 15 yrs ago; laminectomy with spinal fusion L4/5 ~ 2013 with no significant improvement  PAIN:  Are you having pain? Yes: NPRS scale: 4/10; worst in the past week 5/10 Pain location: LB to coccyx to bilat buttocks  Pain description: throbbing; sometimes sharp;  pain less frequent  Aggravating factors: heavy lifting otherwise unknown cause  Relieving factors: rest; use of back brace for work in the yard   PRECAUTIONS: None  WEIGHT BEARING RESTRICTIONS: No  FALLS:  Has patient fallen in last 6 months? 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  DIAGNOSTIC FINDINGS:  MRI 10/30/20: IMPRESSION: 1. Laminectomy and fusion at L4-5 without residual or recurrent stenosis. 2. Progressive adjacent level disease with severe central canal stenosis and moderate to severe foraminal narrowing bilaterally at L3-4. 3. Stable mild subarticular and moderate foraminal stenosis bilaterally at L2-3. 4. Moderate facet hypertrophy bilaterally at L5-S1 without significant stenosis.  PATIENT SURVEYS:  Modified Oswestry 24/50; 48%  Modified Oswestry 6/50; 12%    SENSATION: Sometimes feels a disconnect between the hip and knee on L LE over the past several years which seems to be increasing in frequency   MUSCLE LENGTH: Hamstrings: Right 65 deg; Left 60 deg Thomas test: tight bilat  Piriformis - tight L > R   POSTURE: Alm has increased weakness L hip and LE compared to R; decreased L knee terminal knee extension; L trunk lateral flexed; R shoulder elevated; head slightly tilted L.  PALPATION: Tenderness L lateral sacral border distal toward coccyx; tenderness with PA mobs mid lumbar   LUMBAR ROM: 12/12/23: decreased pain and improved mobility/ROM noted through the lumbar spine   [facility] eval 12/12/23  Flexion 70% pull  75% pull   Extension 30% pain bilat LB  35% pull R > L   Right lateral flexion 70% tight  90%   Left lateral flexion 70% tight  95%   Right rotation 20% pull R scapula 40%  Left rotation 10% pull R scapula 40%   (Blank rows = not tested)  LOWER EXTREMITY ROM:     Active  Right eval Left eval Left  12/05/23  Hip flexion     Hip extension     Hip abduction     Hip  adduction     Hip internal rotation  Tight  Decreased tightness   Hip external rotation  Tight  Decreased tightness   Knee flexion     Knee extension     Ankle dorsiflexion     Ankle plantarflexion     Ankle inversion     Ankle eversion      (Blank rows = not tested)  LOWER EXTREMITY MMT:  decreased core strength; LE strength WFL's bilat    12/12/23: improving core strength and stability with patient demonstrating improved spinal alignment with functional activities and exercises   L LE strength and stability improving with functional activities   MMT Right eval Left eval  Hip flexion    Hip extension    Hip abduction    Hip adduction    Hip internal rotation    Hip external rotation    Knee flexion    Knee extension    Ankle dorsiflexion    Ankle plantarflexion    Ankle inversion    Ankle eversion     (Blank rows = not tested)  LUMBAR SPECIAL TESTS:  Straight leg raise test: Negative and Slump test: Negative  FUNCTIONAL TESTS:  5 times sit to stand: 15.96 sec pulling center of sacral area  12/12/23: 15.88 sec no pulling or pain; no use of UE's  SLS; R/L 10+ sec   GAIT: Distance walked: 40 feet Assistive device utilized: None Level of assistance: Complete Independence Comments: WFL's     [facility] Adult PT Treatment:                                                DATE: 12/26/2023 Therapeutic Exercise: Piriformis stretch 30 sec x 2 R/L  Bridge with LE extension lifting R x 5; L x 10  Hooklying shoulder flexion with black TB btn hands x 10 x 2  90/90 heel tap with core engaged x 10 x 2  Hip IR black TB at ankles ball btn knees bilat x 15; L x 10; R x 10; bilat x 10  Side steps black TB at ankles R/L lead with heels 10 feet x 4 x R/L  Manual work   TPR iliacus R/L 60 sec   Psoas ball release prone ~ 2 min R/L  Neuromuscular re-ed: SLS 30 sec R/L  SLS with fwd/side/back toe tap x 10 R/L   Therapeutic Activity:  Sit to stand and stand to sit with small range up and  down hinged hip chest up focus on the lumbar curve Paloff press R/L 10# cable  Paloff press with shoulder flex/ext and figure 8 R/L 10 # cable  Cable backward walking 20# x 10  Cable sidesteps 20# x 10 R/L  Lat pull down 20# cable x 20    [facility] Adult PT Treatment:                                                DATE: 12/20/2023 Therapeutic Exercise: Bridge with LE extension lifting R x 5; L x 10  Hooklying shoulder flexion with black TB btn hands x 10 x 2  90/90 heel tap with core engaged x 10 x 2  Hip IR black TB at ankles ball btn knees bilat x 15; L x 10; R x 10; bilat  x 10  Side steps black TB at ankles R/L lead with heels 10 feet x 4 x R/L  Neuromuscular re-ed: SLS 30 sec R/L  Diver x 5 x 2 R/L  Therapeutic Activity: Counter plank   Counter plank with variation - lifting one leg into extension alternating x 10 R/L  Counter plank with variation - lifting one arm into shoulder flexion alternating x 10 R/L Counter push up x 5 to observe technique Sit to stand and stand to sit with small range up and down hinged hip chest up focus on the lumbar curve Resisted dead lift + green super band 45-120 Paloff press R/L Paloff press with shoulder flex/ext and figure 8 R/L Cable backward walking 20# x 10  Cable sidesteps 20# x 10 R/L   [facility] Adult PT Treatment:                                                DATE: 12/12/2023 Therapeutic Exercise: Hip IR black TB at ankles ball btn knees bilat x 15; L x 10; R x 10; bilat x 10  Side steps black TB at ankles R/L lead with heels 10 feet x 4 x R/L  Neuromuscular re-ed: SLS 30 sec R/L  Diver x 10 R/L  Therapeutic Activity: Counter plank 30/60/60 sec  Counter push up x 5 to observe technique Sit to stand  and stand to sit with small range up and down hinged hip chest up focus on the lumbar curve Resisted dead lift + purple super band  Paloff press R/L Paloff press with shoulder flex/ext and figure 8 R/L Cable backward walking 20# x 10  Cable  sidesteps 20# x 10 R/L    [facility] Adult PT Treatment:                                                DATE: 12/05/2023 Therapeutic Exercise: Hip IR black TB at ankles ball btn knees  Neuromuscular re-ed: SLS 30 sec R/L  Therapeutic Activity: Sit to stand  and stand to sit with small range up and down hinged hip chest up Resisted dead lift + purple super band  Paloff press R/L Paloff press with shoulder flex/ext and figure 8 R/L Cable backward walking 20# x 10  Cable sidesteps 20# x 10 R/L    PATIENT EDUCATION:  Education details: Updated HEP  Person educated: Patient Education method: Explanation, Demonstration, Tactile cues, Verbal cues, and Handouts Education comprehension: verbalized understanding, returned demonstration, verbal cues required, tactile cues required, and needs further education  HOME EXERCISE PROGRAM: Access Code: [email or web address] URL: [email or web address] Date: 12/12/2023 Prepared by: Krrish Freund  Exercises - Supine Single Knee to Chest Stretch  - 1 x daily - 7 x weekly - 3 sets - 10 reps - Supine Double Knee to Chest  - 1 x daily - 7 x weekly - 3 sets - 10 reps - Seated Lumbar Flexion Stretch  - 1 x daily - 7 x weekly - 1 sets - 5 reps - 5-10 sec hold - Supine Dead Bug with Leg Extension  - 1 x daily - 7 x weekly - 3 sets - 10 reps - Supine 90/90 Alternating Heel Touches with Posterior Pelvic Tilt  - 1  x daily - 7 x weekly - 3 sets - 10 reps - Bridge with Hip Abduction and Resistance  - 1 x daily - 7 x weekly - 3 sets - 10 reps - Clam with Resistance  - 1 x daily - 7 x weekly - 3 sets - 10 reps - Sidelying Reverse Clamshell with Resistance  - 1 x daily - 7 x weekly - 3 sets - 10 reps - Sidelying Hip Abduction with Resistance at Thighs  - 1 x daily - 7 x weekly - 3 sets - 10 reps - Squatting Anti-Rotation Press  - 1 x daily - 7 x weekly - 3 sets - 10 reps - Standing 3-Way Leg Reach with Resistance at Ankles and Unilateral Counter Support  - 1 x daily - 7 x  weekly - 3 sets - 10 reps - Deadlift with Resistance  - 1 x daily - 7 x weekly - 3 sets - 10 reps - Single Leg Stance  - 1 x daily - 7 x weekly - 1 sets - 4 reps - 30 sec hold - Right Standing Lateral Shift Correction at Wall - Repetitions  - 1 x daily - 7 x weekly - 3 sets - 10 reps - The Diver  - 2 x daily - 7 x weekly - 1 sets - 10 reps - 2-3 sec  hold - Plank on Counter  - 2 x daily - 7 x weekly - 1 sets - 3 reps - 30 sec  hold  ASSESSMENT:  CLINICAL IMPRESSION: Note tightness in the anterior hips through the iliacus and psoas musculature. Introduced TPR for iliacus and myofacial ball release for psoas and abdominals. Continued work on core stabilization and strengthening as well as LE strengthening focus on L LE to improved strength in L to closer to strength in R LE.     Re-evaluation: Alm continues work on core and LE strengthening. Persistent weakness L hip and LE compared to R; decreased L knee terminal knee extension; L trunk lateral flexed; R shoulder elevated; head slightly tilted L. He has accomplished short term goals and long term goal for improving modified Oswestry score and tolerating 30-40 min of exercise. He progressing well toward remaining long term goals of therapy. Continued with core and LE strengthening. Patient will benefit from continued treatment to achieve goals and reach maximum rehab potential. Will plan to decrease frequency to 1x/wk to progress strengthening and stabilization and move toward independent HEP.   EVAL: Patient is a 63 y.o. male who was seen today for physical therapy evaluation and treatment for chronic bilat LBP with pain into the sacrum and bilat buttock. He has a history of spinal fusion L4/5 2013 with no significant change in symptoms. Patient reports increased pain in the LB and sacral area in the past few weeks. He has limited trunk and LE mobility; poor core strength and stability; muscular tightness to palpation lumbar and sacral spine; pain  with functional activities constant in nature, variable in intensity. Patient will benefit from PT to address problems identified.   GOALS: Goals reviewed with patient? Yes  SHORT TERM GOALS: Target date: 11/13/2023  Independent in initial HEP  Baseline: Goal status: MET  2.  Education re-back care and body mechanics including sitting, standing, lying positions for work and home  Baseline:  Goal status:  MET  3.  Patient tolerates core strengthening and stabilization exercise program for 20-30 min  Baseline:  Goal status: MET   LONG TERM GOALS: Target  date:  01/23/2024   Decrease frequency, intensity, and/or duration of LBP by 30-50% allowing patient to participate in work and leisure activities with less limitations  Baseline:  Goal status: partially met   2.  Improve core strength and stability with patient to tolerate 30-40 min of core strengthening exercises and activities with no increase in pain  Baseline:  Goal status: MET  3.  Increase core strength and stability with patient to report decreased dependence on brace/lumbar support for yard work  Baseline:  Goal status: on going   4.  Improve Modified Oswestry score by 10 points  Baseline: 24/50; 48%  12/12/23: 6/50; 12%  Goal status: met  5.  Independent in HEP including aquatic program as indicated  Baseline:  Goal status: on going   PLAN:  PT FREQUENCY: 1x/week  PT DURATION: 6 weeks   PLANNED INTERVENTIONS: 97164- PT Re-evaluation, 97110-Therapeutic exercises, 97530- Therapeutic activity, 97112- Neuromuscular re-education, 97535- Self Care, [id number]- Manual therapy, [phone]- Aquatic Therapy, Patient/Family education, Taping, Dry Needling, and Joint mobilization.  PLAN FOR NEXT SESSION:  Hip/glute/core strengthening; strengthen hip rotation and functional gluts L > R; progress to independent home program   Try lateral heel tap on step; cross chest hand tap in counter plank position; revisit counter planks       Hyland Mollenkopf SHAUNNA Baptist, PT 12/26/2023, 3:35 PM

## 2024-01-01 DIAGNOSIS — E7800 Pure hypercholesterolemia, unspecified: Secondary | ICD-10-CM | POA: Diagnosis not present

## 2024-01-02 ENCOUNTER — Telehealth: Payer: Self-pay | Admitting: Cardiology

## 2024-01-02 ENCOUNTER — Ambulatory Visit (HOSPITAL_COMMUNITY)
Admission: RE | Admit: 2024-01-02 | Discharge: 2024-01-02 | Disposition: A | Discharge status: Home / Self Care | Source: Ambulatory Visit | Attending: Cardiology | Admitting: Cardiology

## 2024-01-02 ENCOUNTER — Ambulatory Visit: Payer: Self-pay | Admitting: Cardiology

## 2024-01-02 DIAGNOSIS — R0609 Other forms of dyspnea: Secondary | ICD-10-CM

## 2024-01-02 LAB — LIPID PANEL
Chol/HDL Ratio: 2.4 ratio (ref 0.0–5.0)
Cholesterol, Total: 118 mg/dL (ref 100–199)
HDL: 50 mg/dL (ref >39)
LDL Chol Calc (NIH): 57 mg/dL (ref 0–99)
Triglycerides: 48 mg/dL (ref 0–149)
VLDL Cholesterol Cal: 11 mg/dL (ref 5–40)

## 2024-01-02 LAB — HEPATIC FUNCTION PANEL
ALT: 14 IU/L (ref 0–44)
AST: 20 IU/L (ref 0–40)
Albumin: 4.5 g/dL (ref 3.9–4.9)
Alkaline Phosphatase: 52 IU/L (ref 44–121)
Bilirubin Total: 0.4 mg/dL (ref 0.0–1.2)
Bilirubin, Direct: 0.15 mg/dL (ref 0.00–0.40)
Total Protein: 6.3 g/dL (ref 6.0–8.5)

## 2024-01-02 NOTE — Telephone Encounter (Signed)
 Sari from [facility] calling to report the pt can't complete his testing due to being claustrophobic and would like medication.

## 2024-01-03 MED ORDER — DIAZEPAM 5 MG PO TABS
ORAL_TABLET | ORAL | 0 refills | Status: AC
Start: 2024-01-03 — End: ?

## 2024-01-03 NOTE — Telephone Encounter (Signed)
 Spoke with pt, Aware of dr vertie recommendations.  Script called into the patient pharmacy.

## 2024-01-07 ENCOUNTER — Encounter: Payer: Self-pay | Admitting: Cardiology

## 2024-01-15 ENCOUNTER — Encounter: Payer: Self-pay | Admitting: Sports Medicine

## 2024-01-18 ENCOUNTER — Ambulatory Visit: Payer: Self-pay | Admitting: Cardiology

## 2024-01-18 ENCOUNTER — Other Ambulatory Visit: Payer: Self-pay | Admitting: Cardiology

## 2024-01-18 ENCOUNTER — Ambulatory Visit (HOSPITAL_COMMUNITY)
Admission: RE | Admit: 2024-01-18 | Discharge: 2024-01-18 | Disposition: A | Discharge status: Home / Self Care | Source: Ambulatory Visit | Attending: Cardiology | Admitting: Cardiology

## 2024-01-18 DIAGNOSIS — R0609 Other forms of dyspnea: Secondary | ICD-10-CM | POA: Insufficient documentation

## 2024-01-18 MED ORDER — GADOBUTROL 1 MMOL/ML IV SOLN
11.0000 mL | Freq: Once | INTRAVENOUS | Status: AC | PRN
  Administered 2024-01-18: 11 mL via INTRAVENOUS

## 2024-02-21 ENCOUNTER — Other Ambulatory Visit: Payer: Self-pay | Admitting: Family Medicine

## 2024-02-21 DIAGNOSIS — Z0000 Encounter for general adult medical examination without abnormal findings: Secondary | ICD-10-CM

## 2024-03-04 ENCOUNTER — Other Ambulatory Visit: Payer: Self-pay | Admitting: Family Medicine

## 2024-03-04 ENCOUNTER — Ambulatory Visit: Admitting: Family Medicine

## 2024-03-04 DIAGNOSIS — M545 Low back pain, unspecified: Secondary | ICD-10-CM

## 2024-03-10 ENCOUNTER — Ambulatory Visit: Admitting: Family Medicine

## 2024-03-10 ENCOUNTER — Encounter: Payer: Self-pay | Admitting: Family Medicine

## 2024-03-10 VITALS — BP 144/79 | HR 56 | Ht 71.0 in | Wt 198.0 lb

## 2024-03-10 DIAGNOSIS — I10 Essential (primary) hypertension: Secondary | ICD-10-CM

## 2024-03-10 DIAGNOSIS — E039 Hypothyroidism, unspecified: Secondary | ICD-10-CM

## 2024-03-10 DIAGNOSIS — E785 Hyperlipidemia, unspecified: Secondary | ICD-10-CM | POA: Diagnosis not present

## 2024-03-10 DIAGNOSIS — Z0000 Encounter for general adult medical examination without abnormal findings: Secondary | ICD-10-CM

## 2024-03-10 DIAGNOSIS — Z125 Encounter for screening for malignant neoplasm of prostate: Secondary | ICD-10-CM

## 2024-03-10 MED ORDER — HYDROCODONE-ACETAMINOPHEN 5-325 MG PO TABS: 1.0000 | ORAL_TABLET | Freq: Four times a day (QID) | ORAL | 0 refills | Status: AC | PRN

## 2024-03-10 NOTE — Progress Notes (Signed)
 Brian Russell - 63 y.o. male MRN [id number]  Date of birth: May 26, 1960  Subjective Chief Complaint  Patient presents with   Hypertension    HPI Brian Russell is a 63 y.o. male here today for annual exam  He reports that he is doing well.   He continues on valsartan , chlorthalidone  and metoprolol  for management of HTN.  BP is fairly well controlled.  Denies chest pain, shortness of breath, palpitations, headache or vision changes.    Mood is stable with bupropion .  Tolerating this well at current strength.  Denies side effects.  Using doxepin  for associated insomnia.  Doing well at current strength.   Due for updated lipid panel. Doing well with crestor .   Continues to stay moderately active.  He feels that diet is pretty good.   Review of Systems  Constitutional:  Negative for chills, fever, malaise/fatigue and weight loss.  HENT:  Negative for congestion, ear pain and sore throat.   Eyes:  Negative for blurred vision, double vision and pain.  Respiratory:  Negative for cough and shortness of breath.   Cardiovascular:  Negative for chest pain and palpitations.  Gastrointestinal:  Negative for abdominal pain, blood in stool, constipation, heartburn and nausea.  Genitourinary:  Negative for dysuria and urgency.  Musculoskeletal:  Negative for joint pain and myalgias.  Neurological:  Negative for dizziness and headaches.  Endo/Heme/Allergies:  Does not bruise/bleed easily.  Psychiatric/Behavioral:  Negative for depression. The patient is not nervous/anxious and does not have insomnia.          Allergies  Allergen Reactions   Amlodipine    Buspar [Buspirone]     Past Medical History:  Diagnosis Date   Hypertension    Low thyroid  stimulating hormone (TSH) level     Past Surgical History:  Procedure Laterality Date   fractured bone     JOINT REPLACEMENT     SPINAL FUSION      Social History   Socioeconomic History   Marital status: Married    Spouse name:  Not on file   Number of children: Not on file   Years of education: Not on file   Highest education level: Associate degree: occupational, scientist, product/process development, or vocational program  Occupational History   Occupation: Building Services Engineer  Tobacco Use   Smoking status: Former    Current packs/day: 0.00    Types: Cigarettes    Quit date: 05/2011    Years since quitting: 12.8   Smokeless tobacco: Never  Vaping Use   Vaping status: Never Used  Substance and Sexual Activity   Alcohol use: Yes    Alcohol/week: 2.0 standard drinks of alcohol    Types: 2 Standard drinks or equivalent per week   Drug use: Never   Sexual activity: Yes    Partners: Female  Other Topics Concern   Not on file  Social History Narrative   Not on file   Social Drivers of Health   Financial Resource Strain: Low Risk  (06/03/2023)   Overall Financial Resource Strain (CARDIA)    Difficulty of Paying Living Expenses: Not hard at all  Food Insecurity: No Food Insecurity (06/03/2023)   Hunger Vital Sign    Worried About Running Out of Food in the Last Year: Never true    Ran Out of Food in the Last Year: Never true  Transportation Needs: No Transportation Needs (06/03/2023)   PRAPARE - Administrator, Civil Service (Medical): No    Lack of Transportation (Non-Medical):  No  Physical Activity: Insufficiently Active (06/03/2023)   Exercise Vital Sign    Days of Exercise per Week: 3 days    Minutes of Exercise per Session: 20 min  Stress: No Stress Concern Present (06/03/2023)   [name] of Occupational Health - Occupational Stress Questionnaire    Feeling of Stress : Only a little  Social Connections: Socially Integrated (06/03/2023)   Social Connection and Isolation Panel    Frequency of Communication with Friends and Family: More than three times a week    Frequency of Social Gatherings with Friends and Family: Once a week    Attends Religious Services: More than 4 times per year    Active Member of  Clubs or Organizations: Yes    Attends Engineer, Structural: More than 4 times per year    Marital Status: Married    Family History  Problem Relation Age of Onset   Hypertension Other    Heart attack Other    Stroke Other    Alzheimer's disease Mother    Stroke Father     Health Maintenance  Topic Date Due   HIV Screening  Never done   Hepatitis C Screening  Never done   Influenza Vaccine  08/12/2024 (Originally 12/14/2023)   Pneumococcal Vaccine: 50+ Years (2 of 2 - PCV20 or PCV21) 03/10/2025 (Originally 04/21/2015)   COVID-19 Vaccine (3 - 2025-26 season) 03/26/2025 (Originally 01/14/2024)   Colonoscopy  12/15/2027   DTaP/Tdap/Td (3 - Td or Tdap) 03/15/2032   Zoster Vaccines- Shingrix   Completed   Hepatitis B Vaccines 19-59 Average Risk  Aged Out   HPV VACCINES  Aged Out   Meningococcal B Vaccine  Aged Out     ----------------------------------------------------------------------------------------------------------------------------------------------------------------------------------------------------------------- Physical Exam BP (!) 144/79 (BP Location: Left Arm, Patient Position: Sitting, Cuff Size: Normal)   Pulse (!) 56   Ht 5' 11 (1.803 m)   Wt 198 lb (89.8 kg)   SpO2 98%   BMI 27.62 kg/m   Physical Exam Constitutional:      General: He is not in acute distress. HENT:     Head: Normocephalic and atraumatic.     Right Ear: Tympanic membrane and external ear normal.     Left Ear: Tympanic membrane and external ear normal.  Eyes:     General: No scleral icterus. Neck:     Thyroid : No thyromegaly.  Cardiovascular:     Rate and Rhythm: Normal rate and regular rhythm.     Heart sounds: Normal heart sounds.  Pulmonary:     Effort: Pulmonary effort is normal.     Breath sounds: Normal breath sounds.  Abdominal:     General: Bowel sounds are normal. There is no distension.     Palpations: Abdomen is soft.     Tenderness: There is no abdominal  tenderness. There is no guarding.  Musculoskeletal:     Cervical back: Normal range of motion.  Lymphadenopathy:     Cervical: No cervical adenopathy.  Skin:    General: Skin is warm and dry.     Findings: No rash.  Neurological:     Mental Status: He is alert and oriented to person, place, and time.     Cranial Nerves: No cranial nerve deficit.     Motor: No abnormal muscle tone.  Psychiatric:        Mood and Affect: Mood normal.        Behavior: Behavior normal.     ------------------------------------------------------------------------------------------------------------------------------------------------------------------------------------------------------------------- Assessment and Plan  Well adult exam  Well adult Orders Placed This Encounter  Procedures   CMP14+EGFR   CBC with Differential/Platelet   Lipid Panel With LDL/HDL Ratio   PSA   TSH  Screenings: Per lab orders Immunizations: Flu vaccine given today. Anticipatory guidance/risk factor reduction: Recommendations per AVS.   Meds ordered this encounter  Medications   HYDROcodone -acetaminophen  (NORCO/VICODIN) 5-325 MG tablet    Sig: Take 1 tablet by mouth every 6 (six) hours as needed for moderate pain (pain score 4-6) or severe pain (pain score 7-10).    Dispense:  30 tablet    Refill:  0    Return in about 6 months (around 09/08/2024) for Hypertension.

## 2024-03-10 NOTE — Assessment & Plan Note (Signed)
 Well adult Orders Placed This Encounter  Procedures   CMP14+EGFR   CBC with Differential/Platelet   Lipid Panel With LDL/HDL Ratio   PSA   TSH  Screenings: Per lab orders Immunizations: Flu vaccine given today. Anticipatory guidance/risk factor reduction: Recommendations per AVS.

## 2024-03-24 ENCOUNTER — Encounter: Payer: Self-pay | Admitting: Family Medicine

## 2024-03-25 DIAGNOSIS — E039 Hypothyroidism, unspecified: Secondary | ICD-10-CM | POA: Diagnosis not present

## 2024-03-25 DIAGNOSIS — Z125 Encounter for screening for malignant neoplasm of prostate: Secondary | ICD-10-CM | POA: Diagnosis not present

## 2024-03-25 DIAGNOSIS — E785 Hyperlipidemia, unspecified: Secondary | ICD-10-CM | POA: Diagnosis not present

## 2024-03-25 DIAGNOSIS — I10 Essential (primary) hypertension: Secondary | ICD-10-CM | POA: Diagnosis not present

## 2024-03-26 LAB — CMP14+EGFR
ALT: 20 IU/L (ref 0–44)
AST: 27 IU/L (ref 0–40)
Albumin: 4.7 g/dL (ref 3.9–4.9)
Alkaline Phosphatase: 50 IU/L (ref 47–123)
BUN/Creatinine Ratio: 16 (ref 10–24)
BUN: 16 mg/dL (ref 8–27)
Bilirubin Total: 0.6 mg/dL (ref 0.0–1.2)
CO2: 29 mmol/L (ref 20–29)
Calcium: 9.8 mg/dL (ref 8.6–10.2)
Chloride: 100 mmol/L (ref 96–106)
Creatinine, Ser: 0.99 mg/dL (ref 0.76–1.27)
Globulin, Total: 1.8 g/dL (ref 1.5–4.5)
Glucose: 90 mg/dL (ref 70–99)
Potassium: 4.7 mmol/L (ref 3.5–5.2)
Sodium: 142 mmol/L (ref 134–144)
Total Protein: 6.5 g/dL (ref 6.0–8.5)
eGFR: 85 mL/min/1.73 (ref >59)

## 2024-03-26 LAB — CBC WITH DIFFERENTIAL/PLATELET
Basophils Absolute: 0.1 x10E3/uL (ref 0.0–0.2)
Basos: 1 %
EOS (ABSOLUTE): 0.2 x10E3/uL (ref 0.0–0.4)
Eos: 3 %
Hematocrit: 45.7 % (ref 37.5–51.0)
Hemoglobin: 15.3 g/dL (ref 13.0–17.7)
Immature Grans (Abs): 0 x10E3/uL (ref 0.0–0.1)
Immature Granulocytes: 0 %
Lymphocytes Absolute: 1.4 x10E3/uL (ref 0.7–3.1)
Lymphs: 22 %
MCH: 31.4 pg (ref 26.6–33.0)
MCHC: 33.5 g/dL (ref 31.5–35.7)
MCV: 94 fL (ref 79–97)
Monocytes Absolute: 0.5 x10E3/uL (ref 0.1–0.9)
Monocytes: 8 %
Neutrophils Absolute: 4.1 x10E3/uL (ref 1.4–7.0)
Neutrophils: 66 %
Platelets: 145 x10E3/uL — ABNORMAL LOW (ref 150–450)
RBC: 4.87 x10E6/uL (ref 4.14–5.80)
RDW: 13 % (ref 11.6–15.4)
WBC: 6.2 x10E3/uL (ref 3.4–10.8)

## 2024-03-26 LAB — LIPID PANEL WITH LDL/HDL RATIO
Cholesterol, Total: 133 mg/dL (ref 100–199)
HDL: 60 mg/dL (ref >39)
LDL Chol Calc (NIH): 59 mg/dL (ref 0–99)
LDL/HDL Ratio: 1 ratio (ref 0.0–3.6)
Triglycerides: 66 mg/dL (ref 0–149)
VLDL Cholesterol Cal: 14 mg/dL (ref 5–40)

## 2024-03-26 LAB — PSA: Prostate Specific Ag, Serum: 0.4 ng/mL (ref 0.0–4.0)

## 2024-03-26 LAB — TSH: TSH: 0.565 u[IU]/mL (ref 0.450–4.500)

## 2024-03-31 ENCOUNTER — Encounter: Payer: Self-pay | Admitting: Family Medicine

## 2024-04-02 ENCOUNTER — Other Ambulatory Visit: Payer: Self-pay | Admitting: Family Medicine

## 2024-04-06 ENCOUNTER — Other Ambulatory Visit: Payer: Self-pay | Admitting: Family Medicine

## 2024-04-26 ENCOUNTER — Other Ambulatory Visit: Payer: Self-pay | Admitting: Family Medicine

## 2024-04-26 DIAGNOSIS — Z0000 Encounter for general adult medical examination without abnormal findings: Secondary | ICD-10-CM

## 2024-05-11 ENCOUNTER — Other Ambulatory Visit: Payer: Self-pay | Admitting: Family Medicine

## 2024-05-11 DIAGNOSIS — Z0000 Encounter for general adult medical examination without abnormal findings: Secondary | ICD-10-CM

## 2024-06-15 ENCOUNTER — Other Ambulatory Visit: Payer: Self-pay | Admitting: Family Medicine

## 2024-06-15 DIAGNOSIS — I10 Essential (primary) hypertension: Secondary | ICD-10-CM
